# Patient Record
Sex: Female | Born: 1960 | Race: White | Hispanic: No | Marital: Married | Smoking: Never smoker
Health system: Southern US, Community
[De-identification: ages and names within clinical notes are randomized; demographics above are authoritative.]

## PROBLEM LIST (undated history)

## (undated) DIAGNOSIS — K9041 Non-celiac gluten sensitivity: Secondary | ICD-10-CM

## (undated) DIAGNOSIS — Z8719 Personal history of other diseases of the digestive system: Secondary | ICD-10-CM

## (undated) DIAGNOSIS — K219 Gastro-esophageal reflux disease without esophagitis: Secondary | ICD-10-CM

## (undated) DIAGNOSIS — T7840XA Allergy, unspecified, initial encounter: Secondary | ICD-10-CM

## (undated) DIAGNOSIS — K2970 Gastritis, unspecified, without bleeding: Secondary | ICD-10-CM

## (undated) DIAGNOSIS — K648 Other hemorrhoids: Secondary | ICD-10-CM

## (undated) DIAGNOSIS — M1990 Unspecified osteoarthritis, unspecified site: Secondary | ICD-10-CM

## (undated) DIAGNOSIS — G43909 Migraine, unspecified, not intractable, without status migrainosus: Secondary | ICD-10-CM

## (undated) DIAGNOSIS — F419 Anxiety disorder, unspecified: Secondary | ICD-10-CM

## (undated) DIAGNOSIS — E039 Hypothyroidism, unspecified: Secondary | ICD-10-CM

## (undated) DIAGNOSIS — U071 COVID-19: Secondary | ICD-10-CM

## (undated) DIAGNOSIS — R002 Palpitations: Secondary | ICD-10-CM

## (undated) HISTORY — DX: Anxiety disorder, unspecified: F41.9

## (undated) HISTORY — DX: Palpitations: R00.2

## (undated) HISTORY — DX: Gastro-esophageal reflux disease without esophagitis: K21.9

## (undated) HISTORY — DX: Hypothyroidism, unspecified: E03.9

## (undated) HISTORY — DX: Personal history of other diseases of the digestive system: Z87.19

## (undated) HISTORY — DX: Other hemorrhoids: K64.8

## (undated) HISTORY — DX: Allergy, unspecified, initial encounter: T78.40XA

## (undated) HISTORY — DX: Non-celiac gluten sensitivity: K90.41

## (undated) HISTORY — DX: Gastritis, unspecified, without bleeding: K29.70

## (undated) HISTORY — PX: WISDOM TOOTH EXTRACTION: SHX21

## (undated) HISTORY — DX: Unspecified osteoarthritis, unspecified site: M19.90

## (undated) HISTORY — DX: COVID-19: U07.1

---

## 1998-08-02 ENCOUNTER — Other Ambulatory Visit: Admission: RE | Admit: 1998-08-02 | Discharge: 1998-08-02 | Payer: Self-pay | Admitting: Obstetrics and Gynecology

## 2000-05-28 ENCOUNTER — Other Ambulatory Visit: Admission: RE | Admit: 2000-05-28 | Discharge: 2000-05-28 | Payer: Self-pay | Admitting: Obstetrics and Gynecology

## 2000-06-13 ENCOUNTER — Emergency Department (HOSPITAL_COMMUNITY): Admission: EM | Admit: 2000-06-13 | Discharge: 2000-06-13 | Payer: Self-pay | Admitting: Emergency Medicine

## 2000-08-13 ENCOUNTER — Encounter: Admission: RE | Admit: 2000-08-13 | Discharge: 2000-08-13 | Payer: Self-pay | Admitting: *Deleted

## 2000-08-13 ENCOUNTER — Encounter: Payer: Self-pay | Admitting: Allergy and Immunology

## 2001-12-01 ENCOUNTER — Other Ambulatory Visit: Admission: RE | Admit: 2001-12-01 | Discharge: 2001-12-01 | Payer: Self-pay | Admitting: Obstetrics and Gynecology

## 2001-12-09 ENCOUNTER — Encounter: Payer: Self-pay | Admitting: Internal Medicine

## 2001-12-09 ENCOUNTER — Encounter: Admission: RE | Admit: 2001-12-09 | Discharge: 2001-12-09 | Payer: Self-pay | Admitting: Internal Medicine

## 2003-01-18 ENCOUNTER — Other Ambulatory Visit: Admission: RE | Admit: 2003-01-18 | Discharge: 2003-01-18 | Payer: Self-pay | Admitting: Obstetrics and Gynecology

## 2004-03-05 ENCOUNTER — Other Ambulatory Visit: Admission: RE | Admit: 2004-03-05 | Discharge: 2004-03-05 | Payer: Self-pay | Admitting: Obstetrics and Gynecology

## 2004-12-05 ENCOUNTER — Emergency Department (HOSPITAL_COMMUNITY): Admission: EM | Admit: 2004-12-05 | Discharge: 2004-12-05 | Payer: Self-pay | Admitting: Emergency Medicine

## 2007-04-29 ENCOUNTER — Emergency Department (HOSPITAL_COMMUNITY): Admission: EM | Admit: 2007-04-29 | Discharge: 2007-04-29 | Payer: Self-pay | Admitting: Emergency Medicine

## 2007-10-31 ENCOUNTER — Encounter: Payer: Self-pay | Admitting: Internal Medicine

## 2007-11-10 ENCOUNTER — Encounter: Payer: Self-pay | Admitting: Internal Medicine

## 2008-01-20 ENCOUNTER — Ambulatory Visit: Payer: Self-pay | Admitting: Internal Medicine

## 2008-02-02 ENCOUNTER — Encounter: Payer: Self-pay | Admitting: Internal Medicine

## 2008-04-25 ENCOUNTER — Ambulatory Visit: Payer: Self-pay | Admitting: Internal Medicine

## 2008-04-25 DIAGNOSIS — M8580 Other specified disorders of bone density and structure, unspecified site: Secondary | ICD-10-CM

## 2008-04-25 DIAGNOSIS — E039 Hypothyroidism, unspecified: Secondary | ICD-10-CM | POA: Insufficient documentation

## 2008-04-25 DIAGNOSIS — R51 Headache: Secondary | ICD-10-CM | POA: Insufficient documentation

## 2008-04-25 DIAGNOSIS — N926 Irregular menstruation, unspecified: Secondary | ICD-10-CM | POA: Insufficient documentation

## 2008-04-25 DIAGNOSIS — R519 Headache, unspecified: Secondary | ICD-10-CM | POA: Insufficient documentation

## 2008-04-25 DIAGNOSIS — S82409A Unspecified fracture of shaft of unspecified fibula, initial encounter for closed fracture: Secondary | ICD-10-CM | POA: Insufficient documentation

## 2008-04-26 ENCOUNTER — Encounter (INDEPENDENT_AMBULATORY_CARE_PROVIDER_SITE_OTHER): Payer: Self-pay | Admitting: *Deleted

## 2008-04-26 LAB — CONVERTED CEMR LAB
Free T4: 0.7 ng/dL (ref 0.6–1.6)
T3, Free: 2.3 pg/mL (ref 2.3–4.2)
TSH: 4.41 microintl units/mL (ref 0.35–5.50)

## 2008-06-06 ENCOUNTER — Emergency Department (HOSPITAL_COMMUNITY): Admission: EM | Admit: 2008-06-06 | Discharge: 2008-06-07 | Payer: Self-pay | Admitting: Emergency Medicine

## 2008-09-11 ENCOUNTER — Ambulatory Visit: Payer: Self-pay | Admitting: Internal Medicine

## 2008-09-13 LAB — CONVERTED CEMR LAB
Free T4: 0.7 ng/dL (ref 0.6–1.6)
T3, Free: 2.3 pg/mL (ref 2.3–4.2)
TSH: 6.32 microintl units/mL — ABNORMAL HIGH (ref 0.35–5.50)

## 2008-09-18 ENCOUNTER — Telehealth (INDEPENDENT_AMBULATORY_CARE_PROVIDER_SITE_OTHER): Payer: Self-pay | Admitting: *Deleted

## 2008-09-26 ENCOUNTER — Ambulatory Visit: Payer: Self-pay | Admitting: Internal Medicine

## 2008-09-27 ENCOUNTER — Telehealth (INDEPENDENT_AMBULATORY_CARE_PROVIDER_SITE_OTHER): Payer: Self-pay | Admitting: *Deleted

## 2008-09-28 ENCOUNTER — Ambulatory Visit: Payer: Self-pay | Admitting: Family Medicine

## 2008-11-12 ENCOUNTER — Ambulatory Visit: Payer: Self-pay | Admitting: Internal Medicine

## 2008-11-12 ENCOUNTER — Telehealth (INDEPENDENT_AMBULATORY_CARE_PROVIDER_SITE_OTHER): Payer: Self-pay | Admitting: *Deleted

## 2008-11-18 LAB — CONVERTED CEMR LAB
Folate: >20 ng/mL
Iron: 58 ug/dL (ref 42–145)
Saturation Ratios: 17.7 % — ABNORMAL LOW (ref 20.0–50.0)
TSH: 2.97 microintl units/mL (ref 0.35–5.50)
Transferrin: 233.7 mg/dL (ref 212.0–360.0)
Vitamin B-12: 554 pg/mL (ref 211–911)

## 2008-11-19 ENCOUNTER — Encounter (INDEPENDENT_AMBULATORY_CARE_PROVIDER_SITE_OTHER): Payer: Self-pay | Admitting: *Deleted

## 2008-11-26 ENCOUNTER — Telehealth (INDEPENDENT_AMBULATORY_CARE_PROVIDER_SITE_OTHER): Payer: Self-pay | Admitting: *Deleted

## 2009-04-08 ENCOUNTER — Telehealth (INDEPENDENT_AMBULATORY_CARE_PROVIDER_SITE_OTHER): Payer: Self-pay | Admitting: *Deleted

## 2009-10-03 ENCOUNTER — Telehealth (INDEPENDENT_AMBULATORY_CARE_PROVIDER_SITE_OTHER): Payer: Self-pay | Admitting: *Deleted

## 2009-10-04 ENCOUNTER — Ambulatory Visit: Payer: Self-pay | Admitting: Internal Medicine

## 2009-10-11 LAB — CONVERTED CEMR LAB: TSH: 5.58 microintl units/mL — ABNORMAL HIGH (ref 0.35–5.50)

## 2009-10-21 ENCOUNTER — Ambulatory Visit: Payer: Self-pay | Admitting: Internal Medicine

## 2009-11-06 ENCOUNTER — Ambulatory Visit: Payer: Self-pay | Admitting: Internal Medicine

## 2010-01-22 ENCOUNTER — Ambulatory Visit: Payer: Self-pay | Admitting: Internal Medicine

## 2010-01-27 LAB — CONVERTED CEMR LAB: TSH: 5.98 microintl units/mL — ABNORMAL HIGH (ref 0.35–5.50)

## 2010-02-14 ENCOUNTER — Telehealth: Payer: Self-pay | Admitting: Internal Medicine

## 2010-03-28 ENCOUNTER — Ambulatory Visit
Admission: RE | Admit: 2010-03-28 | Discharge: 2010-03-28 | Payer: Self-pay | Source: Home / Self Care | Attending: Internal Medicine | Admitting: Internal Medicine

## 2010-03-28 ENCOUNTER — Telehealth (INDEPENDENT_AMBULATORY_CARE_PROVIDER_SITE_OTHER): Payer: Self-pay | Admitting: *Deleted

## 2010-03-30 LAB — HM COLONOSCOPY: HM COLON: NORMAL

## 2010-04-01 LAB — CONVERTED CEMR LAB
ALT: 21 units/L (ref 0–35)
Basophils Absolute: 0 10*3/uL (ref 0.0–0.1)
CO2: 27 meq/L (ref 19–32)
Calcium: 9.4 mg/dL (ref 8.4–10.5)
Chloride: 111 meq/L (ref 96–112)
Creatinine, Ser: 0.9 mg/dL (ref 0.4–1.2)
GFR calc non Af Amer: 69.39 mL/min (ref >60.00)
Hemoglobin: 13.5 g/dL (ref 12.0–15.0)
Lymphocytes Relative: 30.5 % (ref 12.0–46.0)
Monocytes Relative: 10.5 % (ref 3.0–12.0)
Neutro Abs: 3.1 10*3/uL (ref 1.4–7.7)
RBC: 4.15 M/uL (ref 3.87–5.11)
RDW: 13.2 % (ref 11.5–14.6)
Total Protein: 7.1 g/dL (ref 6.0–8.3)
WBC: 5.8 10*3/uL (ref 4.5–10.5)

## 2010-04-14 ENCOUNTER — Telehealth: Payer: Self-pay | Admitting: Internal Medicine

## 2010-04-14 ENCOUNTER — Encounter: Payer: Self-pay | Admitting: Internal Medicine

## 2010-04-14 DIAGNOSIS — R454 Irritability and anger: Secondary | ICD-10-CM | POA: Insufficient documentation

## 2010-04-17 ENCOUNTER — Other Ambulatory Visit: Payer: Self-pay | Admitting: Internal Medicine

## 2010-04-17 ENCOUNTER — Ambulatory Visit
Admission: RE | Admit: 2010-04-17 | Discharge: 2010-04-17 | Payer: Self-pay | Source: Home / Self Care | Attending: Internal Medicine | Admitting: Internal Medicine

## 2010-04-18 LAB — FOLLICLE STIMULATING HORMONE: FSH: 67.7 m[IU]/mL

## 2010-04-29 NOTE — Assessment & Plan Note (Signed)
Summary: MIGRAINES/KN   Vital Signs:  Patient profile:   50 year old female Weight:      140.8 pounds Temp:     98.8 degrees F oral Pulse rate:   80 / minute Resp:     15 per minute BP sitting:   118 / 70  (left arm) Cuff size:   regular  Vitals Entered By: Shonna Chock CMA (November 06, 2009 10:50 AM) CC: Migraines and TSH concerns, overall patient not feeling well. Patient with 9 migraines since 51/24/2011, patient seen the [facility] dooctor 1 week ago and will f/u tomorrow, Headaches   CC:  Migraines and TSH concerns, overall patient not feeling well. Patient with 9 migraines since 51/24/2011, patient seen the [facility] dooctor 1 week ago and will f/u tomorrow, and Headaches.  History of Present Illness:  Headaches      This is a 50 year old woman who presents with  an increase in her migraine headaches, 9 since 09/21/2009.  The patient reports nausea, photophobia, and phonophobia, but denies vomiting, sweats, tearing of eyes, nasal congestion, sinus pain, and sinus pressure.  The headache is described as intermittent and pressure-like, initially over crown  but then frontally  & in temples.  The patient denies the following high-risk features: fever, vision loss or change, focal weakness, and altered mental status.  The headaches  have no definite triggers.  Prior treatment has included a triptan and a muscle relaxer. No prodrome or aura.  Current Medications (verified): 1)  Topamax 37.5mg  .... 1 By Mouth At Bedtime 2)  Magnesium 500 Mg Tabs (Magnesium) .Marland Kitchen.. 1 By Mouth Once Daily 3)  Multivitamins  Tabs (Multiple Vitamin) .Marland Kitchen.. 1 By Mouth Q D 4)  Fish Oil  Oil (Fish Oil) .... 600mg  1 By Mouth Once Daily 5)  Vitamin D3 2000 Unit Caps (Cholecalciferol) .Marland Kitchen.. 1 By Mouth Once Daily 6)  Calcium 600 Mg Tabs (Calcium) .Marland Kitchen.. 1 By Mouth Once Daily 7)  Ketoprofen 50 Mg Caps (Ketoprofen) .... As Needed 8)  Imitrex 100 Mg Tabs (Sumatriptan Succinate) .Marland Kitchen.. 1 By Mouth Once Daily 9)  Klonopin 0.5 Mg  Tabs (Clonazepam) .... 1/2 By Mouth Once Daily As Needed 10)  Flexeril 10 Mg Tabs (Cyclobenzaprine Hcl) .... 1/2 By Mouth As Needed 11)  Synthroid 25 Mcg Tabs (Levothyroxine Sodium) .Marland Kitchen.. 1 By Mouth Once Daily Except 1& 1/2 Tues & Sat 12)  Nasonex 50 Mcg/act Susp (Mometasone Furoate) .Marland Kitchen.. 1 Sprays Each Nostril Once Daily As Needed  Allergies: 1)  ! Penicillin  Physical Exam  General:  well-nourished,in no acute distress; alert,appropriate and cooperative throughout examination Eyes:  No corneal or conjunctival inflammation noted. EOMI. Perrla. Field of  Vision grossly normal. Pulses:  R and L carotid  pulses are full and equal bilaterally Neurologic:  alert & oriented X3, cranial nerves II-XII intact, strength normal in all extremities, gait normal, DTRs symmetrical and normal, finger-to-nose normal, and Romberg negative.     Impression & Recommendations:  Problem # 1:  HEADACHE (ICD-784.0) Migraine flares Her updated medication list for this problem includes:    Ketoprofen 50 Mg Caps (Ketoprofen) .Marland Kitchen... As needed    Imitrex 100 Mg Tabs (Sumatriptan succinate) .Marland Kitchen... 1 by mouth once daily  Problem # 2:  HYPOTHYROIDISM (ICD-244.9)  Her updated medication list for this problem includes:    Synthroid 25 Mcg Tabs (Levothyroxine sodium) .Marland Kitchen... 1 by mouth once daily except 1& 1/2 tues & sat  Complete Medication List: 1)  Topamax 37.5mg   .... 1 by  mouth at bedtime 2)  Magnesium 500 Mg Tabs (Magnesium) .Marland Kitchen.. 1 by mouth once daily 3)  Multivitamins Tabs (Multiple vitamin) .Marland Kitchen.. 1 by mouth q d 4)  Fish Oil Oil (Fish oil) .... 600mg  1 by mouth once daily 5)  Vitamin D3 2000 Unit Caps (Cholecalciferol) .Marland Kitchen.. 1 by mouth once daily 6)  Calcium 600 Mg Tabs (Calcium) .Marland Kitchen.. 1 by mouth once daily 7)  Ketoprofen 50 Mg Caps (Ketoprofen) .... As needed 8)  Imitrex 100 Mg Tabs (Sumatriptan succinate) .Marland Kitchen.. 1 by mouth once daily 9)  Klonopin 0.5 Mg Tabs (Clonazepam) .... 1/2 by mouth once daily as needed 10)   Flexeril 10 Mg Tabs (Cyclobenzaprine hcl) .... 1/2 by mouth as needed 11)  Synthroid 25 Mcg Tabs (Levothyroxine sodium) .Marland Kitchen.. 1 by mouth once daily except 1& 1/2 tues & sat 12)  Nasonex 50 Mcg/act Susp (Mometasone furoate) .Marland Kitchen.. 1 sprays each nostril once daily as needed 13)  Topiramate 50 Mg Tabs (Topiramate) .Marland Kitchen.. 1 at bedtime  Patient Instructions: 1)  Keep Headcahe  Diary  as discussed. Prescriptions: TOPIRAMATE 50 MG TABS (TOPIRAMATE) 1 at bedtime  #30 x 0   Entered and Authorized by:   Marga Melnick MD   Signed by:   Marga Melnick MD on 11/06/2009   Method used:   Print then Give to Patient   RxID:   [id number]

## 2010-04-29 NOTE — Progress Notes (Signed)
Summary: THYROID CHECK  Phone Note Call from Patient   Caller: Patient Summary of Call: PT CALLS AND STATES THAT DR. HOPPER LIKES TO HAVE HER THYROID CHECKED EVERY YEAR. PLEASE ADVISE WITH THE ORDER SO WE CAN GET AN APPT SET UP FOR HER. PT CAN BE CONTACTED AT [phone] Initial call taken by: Lavell Islam,  October 03, 2009 11:36 AM  Follow-up for Phone Call        TSH 244.9 Follow-up by: Shonna Chock,  October 03, 2009 11:42 AM  Additional Follow-up for Phone Call Additional follow up Details #1::        PT HAS AN APPT FOR THE LAB ON 10/04/09 Additional Follow-up by: Lavell Islam,  October 03, 2009 11:47 AM

## 2010-04-29 NOTE — Progress Notes (Signed)
Summary: Relase records  Phone Note From Other Clinic   Summary of Call: [facility] called requesting records for a referral made by Dr.Hopper on 03/08/09. I called the pt and left a message to get permission relase these records because I do not see a referral in the computer.  Initial call taken by: Army Fossa CMA,  April 08, 2009 3:04 PM  Follow-up for Phone Call        left message to call  office..............Marland KitchenFelecia Deloach CMA  April 11, 2009 8:30 AM   Additional Follow-up for Phone Call Additional follow up Details #1::        spoke with pt dr hopper did not refer pt. pt will come in to sign medical release form so that we may share records with them................Marland KitchenFelecia Deloach CMA  April 12, 2009 8:30 AM

## 2010-04-29 NOTE — Progress Notes (Signed)
Summary: Endo referral  Phone Note Call from Patient   Summary of Call: Patient called noting that she is still not feeling well and would like referral to endo. She would like more in depth testing with endo MD. She would like to know if she can see a woman MD in [place] and her name is Dr. Rachel Moulds MD with [facility]. Please advise. Initial call taken by: Lucious Groves CMA,  February 14, 2010 9:54 AM  Follow-up for Phone Call        COMPLETED REFERRAL FORM & ALL NOTES/LABS FAXED TO [facility], FAX [phone], THEY CONTACT PT, AWAITING APPT.  Magdalen Spatz [facility]  February 14, 2010 3:42 PM

## 2010-04-29 NOTE — Assessment & Plan Note (Signed)
Summary: CHECK UP BEFORE SCRIPT IS FILL--PH   Vital Signs:  Patient profile:   50 year old female Weight:      143.8 pounds BMI:     24.02 Temp:     98.0 degrees F oral Pulse rate:   72 / minute Resp:     15 per minute BP sitting:   116 / 62  (left arm) Cuff size:   regular  Vitals Entered By: Shonna Chock CMA (October 21, 2009 4:27 PM) CC: Follow-up visit: discuss labs    CC:  Follow-up visit: discuss labs .  History of Present Illness: Labs reviewed ; TSH 5.58, borderline hypothyroidism.  Current Medications (verified): 1)  Zoloft 37.5 Mg Tabs (Sertraline Hcl) .Marland Kitchen.. 1 By Mouth Once Daily 2)  Topamax 25 Mg Tabs (Topiramate) .Marland Kitchen.. 1 By Mouth At Bedtime 3)  Magnesium 500 Mg Tabs (Magnesium) .Marland Kitchen.. 1 By Mouth Once Daily 4)  Multivitamins  Tabs (Multiple Vitamin) .Marland Kitchen.. 1 By Mouth Q D 5)  Fish Oil  Oil (Fish Oil) .... 600mg  1 By Mouth Once Daily 6)  Vitamin D3 2000 Unit Caps (Cholecalciferol) .Marland Kitchen.. 1 By Mouth Once Daily 7)  Calcium 600 Mg Tabs (Calcium) .Marland Kitchen.. 1 By Mouth Once Daily 8)  Ketoprofen 50 Mg Caps (Ketoprofen) .... As Needed 9)  Imitrex 100 Mg Tabs (Sumatriptan Succinate) .Marland Kitchen.. 1 By Mouth Once Daily 10)  Klonopin 0.5 Mg Tabs (Clonazepam) .... 1/2 By Mouth Once Daily As Needed 11)  Flexeril 10 Mg Tabs (Cyclobenzaprine Hcl) .... 1/2 By Mouth As Needed 12)  Synthroid 25 Mcg Tabs (Levothyroxine Sodium) .Marland Kitchen.. 1 By Mouth Once Daily 13)  Nasonex 50 Mcg/act Susp (Mometasone Furoate) .Marland Kitchen.. 1 Sprays Each Nostril Once Daily As Needed  Allergies: 1)  ! Penicillin  Past History:  Past Medical History: Hypothyroidism(TSH 8.140 on 10/31/2007 Osteopenia (T  SCORE -1.1 @ FEMORAL NECK) Headache, migraines , Dr Neale Burly Dysfunctional  menses; FSH 40.1 (23-116.3 = post menopausal range) 02/02/08  Past Surgical History: G 0 P0, Dr Tenny Craw Denies surgical history Colonoscopy negative 05/2009  Family History: DM: MGF Father: liver cancer ,colon polyps,HTN Mother: depression, diverticulosis,S/P  partial colectomy, hypothyroidism  Siblings: sister colon polyps,HTN  Review of Systems General:  Denies fatigue and weight loss; Weight  up 6#. Eyes:  Denies blurring, double vision, and vision loss-both eyes. ENT:  Denies difficulty swallowing and hoarseness. CV:  Denies palpitations. GI:  Complains of constipation; denies diarrhea. Derm:  Complains of hair loss; denies changes in nail beds and dryness. Neuro:  Denies numbness and tingling. Endo:  Denies cold intolerance and heat intolerance.  Physical Exam  General:  Appears younger than age,well-nourished; alert,appropriate and cooperative throughout examination Eyes:  No corneal or conjunctival inflammation noted.No lid lag Neck:  No deformities, masses, or tenderness noted. Lungs:  Normal respiratory effort, chest expands symmetrically. Lungs are clear to auscultation, no crackles or wheezes. Heart:  Normal rate and regular rhythm. S1 and S2 normal without gallop, murmur, click, rub.S4 Pulses:  R and L carotid,radial,dorsalis pedis and posterior tibial pulses are full and equal bilaterally Extremities:  No clubbing, cyanosis, edema. Neurologic:  alert & oriented X3 and DTRs symmetrical and normal.   No tremor Skin:  Intact without suspicious lesions or rashes Psych:  memory intact for recent and remote, normally interactive, and good eye contact.     Impression & Recommendations:  Problem # 1:  HYPOTHYROIDISM (ICD-244.9)  Her updated medication list for this problem includes:    Synthroid 25 Mcg Tabs (Levothyroxine sodium) .Marland KitchenMarland KitchenMarland KitchenMarland Kitchen  1 by mouth once daily except 1& 1/2 tues & sat  Problem # 2:  OSTEOPENIA (ICD-733.90)  Complete Medication List: 1)  Zoloft 37.5 Mg Tabs (sertraline Hcl)  .Marland Kitchen.. 1 by mouth once daily 2)  Topamax 25 Mg Tabs (Topiramate) .Marland Kitchen.. 1 by mouth at bedtime 3)  Magnesium 500 Mg Tabs (Magnesium) .Marland Kitchen.. 1 by mouth once daily 4)  Multivitamins Tabs (Multiple vitamin) .Marland Kitchen.. 1 by mouth q d 5)  Fish Oil Oil (Fish  oil) .... 600mg  1 by mouth once daily 6)  Vitamin D3 2000 Unit Caps (Cholecalciferol) .Marland Kitchen.. 1 by mouth once daily 7)  Calcium 600 Mg Tabs (Calcium) .Marland Kitchen.. 1 by mouth once daily 8)  Ketoprofen 50 Mg Caps (Ketoprofen) .... As needed 9)  Imitrex 100 Mg Tabs (Sumatriptan succinate) .Marland Kitchen.. 1 by mouth once daily 10)  Klonopin 0.5 Mg Tabs (Clonazepam) .... 1/2 by mouth once daily as needed 11)  Flexeril 10 Mg Tabs (Cyclobenzaprine hcl) .... 1/2 by mouth as needed 12)  Synthroid 25 Mcg Tabs (Levothyroxine sodium) .Marland Kitchen.. 1 by mouth once daily except 1& 1/2 tues & sat 13)  Nasonex 50 Mcg/act Susp (Mometasone furoate) .Marland Kitchen.. 1 sprays each nostril once daily as needed  Patient Instructions: 1)  Please schedule a follow-up  Lab appointment in 3 months for TSH (244.9). BMD every 25 months; monitor vitamin D level annually ; goal = 40-60. Calcium 600 mg two times a day ; take @ least 1000 International Units vit D3 once daily . Annual monitor of Lipids  Prescriptions: SYNTHROID 25 MCG TABS (LEVOTHYROXINE SODIUM) 1 by mouth once daily EXCEPT 1& 1/2 Tues & Sat  #90 x 3   Entered and Authorized by:   Marga Melnick MD   Signed by:   Marga Melnick MD on 10/21/2009   Method used:   Print then Give to Patient   RxID:   [id number]

## 2010-05-01 NOTE — Progress Notes (Signed)
Summary: Bone Density Test  Phone Note Call from Patient Call back at [phone]   Caller: Patient Reason for Call: Talk to Doctor Summary of Call: Patient came in this morning stating that the last time she was in to see Dr. Alwyn Ren he told her she should have another bone density test soon and to speak with her GYN about this. In the mean time she recieved a card in the mail from [employer] about it being time to schedule another bone density, that was where she had one in the past. She spoke with her GYN and he said he would not recommend this until she was at least 59, but if Dr. Alwyn Ren said she needed one he would need to provide the required referral. She would like for [place] to put the referral in if you think this is something she still needs. Please advise.  Initial call taken by: Harold Barban,  March 28, 2010 8:49 AM  Follow-up for Phone Call        she has documented Osteopenia (733.90); BMD every 25 months & vitamin D level every 12 months Follow-up by: Marga Melnick MD,  March 28, 2010 1:36 PM  Additional Follow-up for Phone Call Additional follow up Details #1::        Referral placed, patient is aware.  Additional Follow-up by: Harold Barban,  March 28, 2010 1:44 PM

## 2010-05-01 NOTE — Progress Notes (Signed)
Summary: Labs needed  Phone Note Call from Patient   Summary of Call: Patient called about her lab results, she needs them sent to her MD. She is aware that this is done.   Patient order was not clear, so FSH and Estrodial were not done on 12/30. I did try to add FSH at the time and lab did not permit. Estrodial was handwritten and later confirmed. All other labs were faxed to pt GYN. Patient is aware and will come back for these to be drawn. Initial call taken by: Lucious Groves CMA,  April 14, 2010 10:59 AM

## 2010-05-07 ENCOUNTER — Telehealth: Payer: Self-pay | Admitting: Internal Medicine

## 2010-05-07 NOTE — Consult Note (Signed)
Summary: [facility]  [facility]   Imported By: Lanelle Bal 04/29/2010 13:02:57  _____________________________________________________________________  External Attachment:    Type:   Image     Comment:   External Document

## 2010-05-12 ENCOUNTER — Encounter: Payer: Self-pay | Admitting: Internal Medicine

## 2010-05-15 NOTE — Progress Notes (Signed)
Summary: RX request--Migraine/nausea  Phone Note Call from Patient Call back at [phone]   Summary of Call: Patient called noting that she was out of work today with migraine and really bad nausea. Patient notes that she is treated by the [facility], but her MD is out of town for a conference. [facility] for migraine as well as nausea (phenergan). Patient would like for her husband to be able to pick it up today. Please advise. Initial call taken by: Lucious Groves CMA,  May 07, 2010 4:06 PM  Follow-up for Phone Call        Phenergan 25 mg  q 6 hrs as needed #6. Imitrex 100 mg #6 or 9 , whichever her plan allows Follow-up by: Marga Melnick MD,  May 07, 2010 4:24 PM  Additional Follow-up for Phone Call Additional follow up Details #1::        I think her plan will allow 9, which is that standard. Patient notified that rxs were sent. Lucious Groves CMA  May 07, 2010 4:34 PM     New/Updated Medications: PROMETHAZINE HCL 25 MG TABS (PROMETHAZINE HCL) 1 by mouth q 6h as needed nausea IMITREX 100 MG TABS (SUMATRIPTAN SUCCINATE) 1 by mouth at onset of migraine Prescriptions: IMITREX 100 MG TABS (SUMATRIPTAN SUCCINATE) 1 by mouth at onset of migraine  #9 x 0   Entered by:   Lucious Groves CMA   Authorized by:   Marga Melnick MD   Signed by:   Lucious Groves CMA on 05/07/2010   Method used:   Printed then faxed to ...       [facility]  [address] [phone]* (retail)       [address]       [place], [place]  [address]       Ph: [phone] or [phone]       Fax: [phone]   RxID:   [id number] PROMETHAZINE HCL 25 MG TABS (PROMETHAZINE HCL) 1 by mouth q 6h as needed nausea  #6 x 0   Entered by:   Lucious Groves CMA   Authorized by:   Marga Melnick MD   Signed by:   Lucious Groves CMA on 05/07/2010   Method used:   Printed then faxed to ...       [facility] [address] [phone]* (retail)       [address]       [place], [place]  [address]       Ph: [phone] or [phone]       Fax: [phone]   RxID:   [id number]

## 2010-06-02 ENCOUNTER — Encounter: Payer: Self-pay | Admitting: Internal Medicine

## 2010-06-02 DIAGNOSIS — R5381 Other malaise: Secondary | ICD-10-CM | POA: Insufficient documentation

## 2010-06-17 NOTE — Consult Note (Signed)
Summary: [facility]   Imported By: Kassie Mends 06/12/2010 08:57:02  _____________________________________________________________________  External Attachment:    Type:   Image     Comment:   External Document

## 2010-07-10 LAB — URINALYSIS, ROUTINE W REFLEX MICROSCOPIC
Bilirubin Urine: NEGATIVE
Ketones, ur: NEGATIVE mg/dL
Nitrite: NEGATIVE
Urobilinogen, UA: 0.2 mg/dL (ref 0.0–1.0)
pH: 5.5 (ref 5.0–8.0)

## 2010-07-10 LAB — COMPREHENSIVE METABOLIC PANEL
AST: 26 U/L (ref 0–37)
Albumin: 4 g/dL (ref 3.5–5.2)
BUN: 13 mg/dL (ref 6–23)
Calcium: 8.7 mg/dL (ref 8.4–10.5)
Chloride: 109 mEq/L (ref 96–112)
Creatinine, Ser: 0.81 mg/dL (ref 0.4–1.2)
GFR calc Af Amer: >60 mL/min (ref >60)
Total Protein: 6.9 g/dL (ref 6.0–8.3)

## 2010-07-10 LAB — CBC
HCT: 40 % (ref 36.0–46.0)
MCV: 92.4 fL (ref 78.0–100.0)
Platelets: 175 10*3/uL (ref 150–400)
RDW: 12.4 % (ref 11.5–15.5)
WBC: 6.5 10*3/uL (ref 4.0–10.5)

## 2010-07-10 LAB — CLOSTRIDIUM DIFFICILE EIA

## 2010-07-10 LAB — URINE MICROSCOPIC-ADD ON

## 2010-07-10 LAB — DIFFERENTIAL
Basophils Absolute: 0.1 10*3/uL (ref 0.0–0.1)
Eosinophils Relative: 0 % (ref 0–5)
Lymphocytes Relative: 5 % — ABNORMAL LOW (ref 12–46)
Lymphs Abs: 0.3 10*3/uL — ABNORMAL LOW (ref 0.7–4.0)
Monocytes Absolute: 0.2 10*3/uL (ref 0.1–1.0)
Monocytes Relative: 4 % (ref 3–12)
Neutro Abs: 5.9 10*3/uL (ref 1.7–7.7)

## 2010-07-10 LAB — POCT PREGNANCY, URINE: Preg Test, Ur: NEGATIVE

## 2010-07-29 DIAGNOSIS — E039 Hypothyroidism, unspecified: Secondary | ICD-10-CM

## 2010-07-29 HISTORY — DX: Hypothyroidism, unspecified: E03.9

## 2010-08-09 ENCOUNTER — Emergency Department (HOSPITAL_COMMUNITY): Payer: BC Managed Care – PPO

## 2010-08-09 ENCOUNTER — Emergency Department (HOSPITAL_COMMUNITY)
Admission: EM | Admit: 2010-08-09 | Discharge: 2010-08-09 | Disposition: A | Discharge status: Home / Self Care | Payer: BC Managed Care – PPO | Attending: Emergency Medicine | Admitting: Emergency Medicine

## 2010-08-09 DIAGNOSIS — R51 Headache: Secondary | ICD-10-CM | POA: Insufficient documentation

## 2010-08-09 DIAGNOSIS — R110 Nausea: Secondary | ICD-10-CM | POA: Insufficient documentation

## 2012-06-07 ENCOUNTER — Ambulatory Visit (INDEPENDENT_AMBULATORY_CARE_PROVIDER_SITE_OTHER): Payer: BC Managed Care – PPO | Admitting: Family Medicine

## 2012-06-07 VITALS — BP 110/64 | HR 81 | Temp 98.4°F | Resp 16 | Ht 64.75 in | Wt 115.0 lb

## 2012-06-07 MED ORDER — DEXAMETHASONE SODIUM PHOSPHATE 10 MG/ML IJ SOLN: 10.0000 mg | Freq: Once | INTRAMUSCULAR | Status: DC

## 2012-06-07 MED ORDER — KETOROLAC TROMETHAMINE 60 MG/2ML IM SOLN
60.0000 mg | Freq: Once | INTRAMUSCULAR | Status: AC
  Administered 2012-06-07: 60 mg via INTRAMUSCULAR

## 2012-06-07 MED ORDER — DEXAMETHASONE 1 MG/ML PO CONC
10.0000 mg | Freq: Once | ORAL | Status: AC
  Administered 2012-06-07: 10 mg via ORAL

## 2012-06-07 MED ORDER — PROMETHAZINE HCL 50 MG/ML IJ SOLN
25.0000 mg | Freq: Once | INTRAMUSCULAR | Status: AC
  Administered 2012-06-07: 25 mg via INTRAMUSCULAR

## 2012-06-07 NOTE — Patient Instructions (Addendum)
Thank you for coming in today. If you are getting worse please go to the ER.  The shot should make you sleepy.  Come back or go to the emergency room if you notice new weakness new numbness problems walking or bowel or bladder problems.

## 2012-06-07 NOTE — Progress Notes (Signed)
Laura Brock is a 52 y.o. female who presents to [facility] today for migraine.   Patient has chronic migraine disorder. She typically take Imitrex and Ketoprofen for migraines and notes this time it has not helped.  She denies any weakness numbness change in vision fevers chills or vomiting. She describes her headache is bitemporal and  pounding.  It has been present for the last 5 days.  She feels well otherwise.  Additionally she denies any clumsiness difficulty with speech and her husband says that her thought process appears to be normal.    PMH: Reviewed chronic migraines and hypothyroidism History  Substance Use Topics  . Smoking status: Never Smoker   . Smokeless tobacco: Not on file  . Alcohol Use: Not on file   ROS as above  Medications reviewed. Current Outpatient Prescriptions  Medication Sig Dispense Refill  . clonazePAM (KLONOPIN) 0.5 MG tablet Take 0.25 mg by mouth 2 (two) times daily as needed for anxiety.      . ergocalciferol (VITAMIN D2) 50000 UNITS capsule Take 50,000 Units by mouth once a week.      . ketoprofen (ORUDIS) 75 MG capsule Take 75 mg by mouth 4 (four) times daily as needed for pain.      . magnesium aspartate (MAGINEX) 615 MG tablet Take 615 mg by mouth 2 (two) times daily.      . Multiple Vitamin (MULTIVITAMIN) tablet Take 1 tablet by mouth daily.      . SUMAtriptan (IMITREX) 100 MG tablet Take 100 mg by mouth every 2 (two) hours as needed for migraine.      . thyroid (ARMOUR) 60 MG tablet Take 60 mg by mouth daily.       Current Facility-Administered Medications  Medication Dose Route Frequency Wasif Simonich Last Rate Last Dose  . dexamethasone (DECADRON) injection 10 mg  10 mg Intramuscular Once Rodolph Bong, MD      . ketorolac (TORADOL) injection 60 mg  60 mg Intramuscular Once Rodolph Bong, MD      . promethazine (PHENERGAN) injection 25 mg  25 mg Intramuscular Once Rodolph Bong, MD        Exam:  BP 110/64  Pulse 81  Temp(Src) 98.4 F (36.9 C) (Oral)   Resp 16  Ht 5' 4.75" (1.645 m)  Wt 115 lb (52.164 kg)  BMI 19.28 kg/m2  SpO2 98% Gen: Well NAD HEENT: EOMI,  MMM PERRL Lungs: CTABL Nl WOB Heart: RRR no MRG Abd: NABS, NT, ND Exts: Non edematous BL  LE, warm and well perfused.  Neuro: Alert and oriented x3 cranial nerves II through XII are intact strength sensation and balance are intact coordination is intact.  Speech and thought process are within normal limits.  No results found for this or any previous visit (from the past 72 hour(s)).  Assessment and Plan: 52 y.o. female with acute migraine with history of chronic migraine disorder.  Failed oral therapy with Imitrex and ketoprofen.  Plan IM Toradol dexamethasone and Phenergan. Followup in the emergency room if worsening or here if not improved. Discussed warning signs or symptoms. Please see discharge instructions. Patient expresses understanding.

## 2012-06-09 ENCOUNTER — Emergency Department (HOSPITAL_COMMUNITY)
Admission: EM | Admit: 2012-06-09 | Discharge: 2012-06-09 | Disposition: A | Discharge status: Home / Self Care | Payer: BC Managed Care – PPO | Attending: Emergency Medicine | Admitting: Emergency Medicine

## 2012-06-09 ENCOUNTER — Other Ambulatory Visit: Payer: Self-pay

## 2012-06-09 ENCOUNTER — Encounter (HOSPITAL_COMMUNITY): Payer: Self-pay | Admitting: *Deleted

## 2012-06-09 DIAGNOSIS — R5381 Other malaise: Secondary | ICD-10-CM | POA: Insufficient documentation

## 2012-06-09 DIAGNOSIS — R110 Nausea: Secondary | ICD-10-CM | POA: Insufficient documentation

## 2012-06-09 DIAGNOSIS — R51 Headache: Secondary | ICD-10-CM | POA: Insufficient documentation

## 2012-06-09 DIAGNOSIS — Z79899 Other long term (current) drug therapy: Secondary | ICD-10-CM | POA: Insufficient documentation

## 2012-06-09 DIAGNOSIS — G4700 Insomnia, unspecified: Secondary | ICD-10-CM | POA: Insufficient documentation

## 2012-06-09 DIAGNOSIS — IMO0001 Reserved for inherently not codable concepts without codable children: Secondary | ICD-10-CM | POA: Insufficient documentation

## 2012-06-09 DIAGNOSIS — G479 Sleep disorder, unspecified: Secondary | ICD-10-CM | POA: Insufficient documentation

## 2012-06-09 DIAGNOSIS — E079 Disorder of thyroid, unspecified: Secondary | ICD-10-CM | POA: Insufficient documentation

## 2012-06-09 DIAGNOSIS — R0602 Shortness of breath: Secondary | ICD-10-CM | POA: Insufficient documentation

## 2012-06-09 DIAGNOSIS — G43909 Migraine, unspecified, not intractable, without status migrainosus: Secondary | ICD-10-CM | POA: Insufficient documentation

## 2012-06-09 HISTORY — DX: Migraine, unspecified, not intractable, without status migrainosus: G43.909

## 2012-06-09 LAB — COMPREHENSIVE METABOLIC PANEL
Alkaline Phosphatase: 52 U/L (ref 39–117)
BUN: 25 mg/dL — ABNORMAL HIGH (ref 6–23)
Chloride: 102 mEq/L (ref 96–112)
Creatinine, Ser: 0.84 mg/dL (ref 0.50–1.10)
GFR calc Af Amer: >90 mL/min (ref >90)
Glucose, Bld: 99 mg/dL (ref 70–99)
Potassium: 3.8 mEq/L (ref 3.5–5.1)
Total Bilirubin: 0.3 mg/dL (ref 0.3–1.2)
Total Protein: 7.4 g/dL (ref 6.0–8.3)

## 2012-06-09 LAB — URINALYSIS, ROUTINE W REFLEX MICROSCOPIC
Ketones, ur: NEGATIVE mg/dL
Leukocytes, UA: NEGATIVE
Nitrite: NEGATIVE
Protein, ur: NEGATIVE mg/dL

## 2012-06-09 LAB — CBC WITH DIFFERENTIAL/PLATELET
Eosinophils Absolute: 0.3 10*3/uL (ref 0.0–0.7)
HCT: 36 % (ref 36.0–46.0)
Hemoglobin: 12.6 g/dL (ref 12.0–15.0)
Lymphs Abs: 2.3 10*3/uL (ref 0.7–4.0)
MCH: 32.8 pg (ref 26.0–34.0)
MCHC: 35 g/dL (ref 30.0–36.0)
Monocytes Absolute: 0.6 10*3/uL (ref 0.1–1.0)
Monocytes Relative: 9 % (ref 3–12)
Neutrophils Relative %: 50 % (ref 43–77)
RBC: 3.84 MIL/uL — ABNORMAL LOW (ref 3.87–5.11)

## 2012-06-09 NOTE — ED Notes (Signed)
Per family member pt had silver teeth fillings removed under advise of Dr Allena Napoleon; pt given shot of DMPS 250 mg on 05/23/12 for continued treatment of trial chelation therapy by Dr Alessandra Bevels; since then pt has had itchy skin; shortness of breath; weakness; insomnia; nausea; headache Thurs to Tues; muscle aches.  Pt began this therapy to help heal "permeability of the gut" to help alleviate food sensitivities.

## 2012-06-09 NOTE — ED Notes (Signed)
Pt with genarlized malaise.

## 2012-06-09 NOTE — ED Provider Notes (Signed)
History     CSN: [id number]  Arrival date & time 06/09/12  1843   First MD Initiated Contact with Patient 06/09/12 2119      No chief complaint on file.   (Consider location/radiation/quality/duration/timing/severity/associated sxs/prior treatment) HPI Comments: Patient comes to the ER for evaluation of possible drug reaction. Patient reports that she has recently started seeing an "integrative medicine" doctor. 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 She had that performed and then her doctor recommended chelation therapy. Patient reports that she declined this, but then was given a shot of DMPS to treat the mercury. Patient reports that since she was given this medication she has had multiple symptoms. Patient reports itchy burning skin, but has not seen any rash. She has felt generalized weakness, nausea and headache. Patient reports generalized muscle aches and cramping. She has been short of breath. She also reports insomnia.   Past Medical History  Diagnosis Date  . Allergy   . Thyroid disease   . Migraine     History reviewed. No pertinent past surgical history.  Family History  Problem Relation Age of Onset  . Cancer Father   . Diabetes Maternal Grandfather   . Heart disease Paternal Grandfather     History  Substance Use Topics  . Smoking status: Never Smoker   . Smokeless tobacco: Not on file  . Alcohol Use: No    OB History   Grav Para Term Preterm Abortions TAB SAB Ect Mult Living                  Review of Systems  Constitutional: Positive for fatigue.  Respiratory: Positive for shortness of breath.   Gastrointestinal: Positive for nausea.  Musculoskeletal: Positive for myalgias.  Skin:       itching  Neurological: Positive for headaches.  Psychiatric/Behavioral: Positive for sleep disturbance.  All other systems reviewed and are negative.    Allergies  Penicillins  Home  Medications   Current Outpatient Rx  Name  Route  Sig  Dispense  Refill  . clonazePAM (KLONOPIN) 0.5 MG tablet   Oral   Take 0.25 mg by mouth 2 (two) times daily as needed for anxiety.         Marland Kitchen ketoprofen (ORUDIS) 75 MG capsule   Oral   Take 75 mg by mouth 4 (four) times daily as needed for pain.         . magnesium aspartate (MAGINEX) 615 MG tablet   Oral   Take 615 mg by mouth 2 (two) times daily.         . Multiple Vitamin (MULTIVITAMIN) tablet   Oral   Take 1 tablet by mouth daily.         . SUMAtriptan (IMITREX) 100 MG tablet   Oral   Take 100 mg by mouth every 2 (two) hours as needed for migraine.         . thyroid (ARMOUR) 60 MG tablet   Oral   Take 60 mg by mouth daily.           BP 99/66  Pulse 71  Temp(Src) 98.4 F (36.9 C) (Oral)  Resp 20  SpO2 98%  Physical Exam  Constitutional: She is oriented to person, place, and time. She appears well-developed and well-nourished. No distress.  HENT:  Head: Normocephalic and atraumatic.  Right Ear: Hearing normal.  Nose: Nose normal.  Mouth/Throat: Oropharynx is clear and moist and mucous  membranes are normal.  Eyes: Conjunctivae and EOM are normal. Pupils are equal, round, and reactive to light.  Neck: Normal range of motion. Neck supple.  Cardiovascular: Normal rate, regular rhythm, S1 normal and S2 normal.  Exam reveals no gallop and no friction rub.   No murmur heard. Pulmonary/Chest: Effort normal and breath sounds normal. No respiratory distress. She exhibits no tenderness.  Abdominal: Soft. Normal appearance and bowel sounds are normal. There is no hepatosplenomegaly. There is no tenderness. There is no rebound, no guarding, no tenderness at McBurney's point and negative Murphy's sign. No hernia.  Musculoskeletal: Normal range of motion.  Neurological: She is alert and oriented to person, place, and time. She has normal strength. No cranial nerve deficit or sensory deficit. Coordination normal.  GCS eye subscore is 4. GCS verbal subscore is 5. GCS motor subscore is 6.  Skin: Skin is warm, dry and intact. No rash noted. No cyanosis.  Psychiatric: She has a normal mood and affect. Her speech is normal and behavior is normal. Thought content normal.    ED Course  Procedures (including critical care time)  Labs Reviewed  CBC WITH DIFFERENTIAL - Abnormal; Notable for the following:    RBC 3.84 (*)    All other components within normal limits  COMPREHENSIVE METABOLIC PANEL - Abnormal; Notable for the following:    BUN 25 (*)    GFR calc non Af Amer 79 (*)    All other components within normal limits  URINALYSIS, ROUTINE W REFLEX MICROSCOPIC   No results found.   Diagnosis: Myalgias, generalized weakness    MDM  Patient comes to the ER with concerns over a medication she was given by Dr. Allena Napoleon. 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 Since the injection patient has had constellation of vague complaints. She does have a history of migraines and she did have a headache earlier in the week, was treated at an urgent care and that has improved. She is now complaining mainly of fatigue, generalized muscle aches and insomnia. All lab work is unremarkable. Case was discussed briefly with the [facility] poison control toxicologist. He did not feel that there was any further interventions were treatment necessary. He did not feel that the medication was specifically to blame, as it is used with fair regularity in [place]. He recommends formal mercury testing by 24-hour urine collection if the patient is still concerned, no further treatment until that is performed.        Gilda Crease, MD 06/09/12 2240

## 2012-06-09 NOTE — ED Notes (Signed)
Called and spoke with poison control per MD request. dispactcher stated she would inform the toxicologist and get him to speak with MD awaiting call back.

## 2012-10-29 ENCOUNTER — Ambulatory Visit (INDEPENDENT_AMBULATORY_CARE_PROVIDER_SITE_OTHER): Payer: BC Managed Care – PPO | Admitting: Family Medicine

## 2012-10-29 ENCOUNTER — Encounter: Payer: Self-pay | Admitting: Family Medicine

## 2012-10-29 VITALS — BP 110/70 | HR 71 | Temp 97.0°F | Ht 64.75 in | Wt 122.2 lb

## 2012-10-29 DIAGNOSIS — K6289 Other specified diseases of anus and rectum: Secondary | ICD-10-CM

## 2012-10-29 NOTE — Progress Notes (Signed)
Subjective:    Patient ID: Laura Brock, female    DOB: 05/04/1960, 52 y.o.   MRN: [id number]  HPI  52 yo pt of Dr. Alwyn Ren here for ? Hemorrhoids.  Has felt a dull rectal ache x 1 month,  still having some occassional pain. Only lasts a minute or two on most days.  Worse after a bowel movement or sitting for periods of time. Only had bright red blood on toilet paper 1 week ago, has not had it sense.  BMs are a little painful but "not bad."   Did have a colonoscopy 3 years ago- 5 year recall due to family history.  Has had some constipation recently.  Just started taking a stool softener.  Eats a gluten free diet- often has problems with constipation.  No decreased appetite.  No abdominal fullness.  Patient Active Problem List   Diagnosis Date Noted  . Rectal pain 10/29/2012  . HYPOTHYROIDISM 04/25/2008  . IRREGULAR MENSES 04/25/2008  . OSTEOPENIA 04/25/2008  . HEADACHE 04/25/2008  . FRACTURE, FIBULA, LEFT 04/25/2008   Past Medical History  Diagnosis Date  . Allergy   . Thyroid disease   . Migraine    No past surgical history on file. History  Substance Use Topics  . Smoking status: Never Smoker   . Smokeless tobacco: Not on file  . Alcohol Use: No   Family History  Problem Relation Age of Onset  . Cancer Father   . Diabetes Maternal Grandfather   . Heart disease Paternal Grandfather    Allergies  Allergen Reactions  . Penicillins Swelling   Current Outpatient Prescriptions on File Prior to Visit  Medication Sig Dispense Refill  . clonazePAM (KLONOPIN) 0.5 MG tablet Take 0.25 mg by mouth 2 (two) times daily as needed for anxiety.      Marland Kitchen ketoprofen (ORUDIS) 75 MG capsule Take 75 mg by mouth 4 (four) times daily as needed for pain.      . magnesium aspartate (MAGINEX) 615 MG tablet Take 615 mg by mouth at bedtime.       . Multiple Vitamin (MULTIVITAMIN) tablet Take 1 tablet by mouth daily.      . SUMAtriptan (IMITREX) 100 MG tablet Take 100 mg by mouth every 2  (two) hours as needed for migraine.      . thyroid (ARMOUR) 60 MG tablet Take 60 mg by mouth daily.       No current facility-administered medications on file prior to visit.   The PMH, PSH, Social History, Family History, Medications, and allergies have been reviewed in [facility], and have been updated if relevant.    Review of Systems See HPI    Objective:   Physical Exam BP 110/70  Pulse 71  Temp(Src) 97 F (36.1 C) (Oral)  Ht 5' 4.75" (1.645 m)  Wt 122 lb 4 oz (55.452 kg)  BMI 20.49 kg/m2  SpO2 95%  General:  Well-developed,well-nourished,in no acute distress; alert,appropriate and cooperative throughout examination Head:  normocephalic and atraumatic.   Abdomen:  Bowel sounds positive,abdomen soft and non-tender without masses, organomegaly or hernias noted. Rectal:  no external abnormalities.   Small anal fissure Psych:  Cognition and judgment appear intact. Alert and cooperative with normal attention span and concentration. No apparent delusions, illusions, hallucinations      Assessment & Plan:  1. Rectal pain ?anal fissure but I cannot rule out internal hemorrhoids. Will refer to GI. Discussed supportive measures, she is hesitant to try suppositories or ointments at this time.  She is aware that she should call PCP if symptoms worsen. The patient indicates understanding of these issues and agrees with the plan.  - Ambulatory referral to Gastroenterology

## 2012-10-29 NOTE — Patient Instructions (Signed)
Anal Fissure, Adult  An anal fissure is a small tear or crack in the skin around the anus. Bleeding from a fissure usually stops on its own within a few minutes. However, bleeding will often reoccur with each bowel movement until the crack heals.   CAUSES    Passing large, hard stools.   Frequent diarrheal stools.   Constipation.   Inflammatory bowel disease (Crohn's disease or ulcerative colitis).   Infections.   Anal sex.  SYMPTOMS    Small amounts of blood seen on your stools, on toilet paper, or in the toilet after a bowel movement.   Rectal bleeding.   Painful bowel movements.   Itching or irritation around the anus.  DIAGNOSIS  Your caregiver will examine the anal area. An anal fissure can usually be seen with careful inspection. A rectal exam may be performed and a short tube (anoscope) may be used to examine the anal canal.  TREATMENT    You may be instructed to take fiber supplements. These supplements can soften your stool to help make bowel movements easier.   Sitz baths may be recommended to help heal the tear. Do not use soap in the sitz baths.   A medicated cream or ointment may be prescribed to lessen discomfort.  HOME CARE INSTRUCTIONS    Maintain a diet high in fruits, whole grains, and vegetables. Avoid constipating foods like bananas and dairy products.   Take sitz baths as directed by your caregiver.   Drink enough fluids to keep your urine clear or pale yellow.   Only take over-the-counter or prescription medicines for pain, discomfort, or fever as directed by your caregiver. Do not take aspirin as this may increase bleeding.   Do not use ointments containing numbing medications (anesthetics) or hydrocortisone. They could slow healing.  SEEK MEDICAL CARE IF:    Your fissure is not completely healed within 3 days.   You have further bleeding.   You have a fever.   You have diarrhea mixed with blood.   You have pain.   Your problem is getting worse rather than  better.  MAKE SURE YOU:    Understand these instructions.   Will watch your condition.   Will get help right away if you are not doing well or get worse.  Document Released: 03/16/2005 Document Revised: 06/08/2011 Document Reviewed: 08/31/2010  [employer] Patient Information 2014 [employer].

## 2012-11-01 ENCOUNTER — Encounter: Payer: Self-pay | Admitting: Gastroenterology

## 2012-11-02 ENCOUNTER — Telehealth: Payer: Self-pay | Admitting: Internal Medicine

## 2012-11-02 NOTE — Telephone Encounter (Signed)
Patient called stating she was seen by Dr. Dayton Martes [facility] and was treated for an anal fissure. At the time of her visit she was offered suppositories, but turned them down. She has since changed her mind and would like these sent to [facility] on [address].

## 2012-11-02 NOTE — Telephone Encounter (Signed)
Please forward to Dr Dayton Martes ; I 'm not sure what she wanted to Rx; I usually use [facility]

## 2012-11-02 NOTE — Telephone Encounter (Signed)
You have not seen this patient in office since prior to 2012. Would you like me to refer her back to Dr. Jena Gauss?

## 2012-11-03 ENCOUNTER — Telehealth: Payer: Self-pay

## 2012-11-03 ENCOUNTER — Other Ambulatory Visit: Payer: Self-pay | Admitting: Internal Medicine

## 2012-11-03 DIAGNOSIS — K600 Acute anal fissure: Secondary | ICD-10-CM

## 2012-11-03 MED ORDER — HYDROCORTISONE ACETATE 25 MG RE SUPP: 25.0000 mg | Freq: Two times a day (BID) | RECTAL | Status: DC

## 2012-11-03 NOTE — Telephone Encounter (Signed)
Patient presented to lobby requesting medication for an anal fissure that she was seen for a the [facility]. Laura Brock had referred her back to provider from [facility] but she was out of the office until Monday. Advised patient that she had not been seen here in several years so it would be good to have an annual wellness exam. Patient agreed. Rx written for anusol and given.

## 2012-11-04 NOTE — Telephone Encounter (Signed)
This was taken care of yesterday afternoon at [facility] office (see subsequent phone note).

## 2012-11-18 ENCOUNTER — Ambulatory Visit: Payer: BC Managed Care – PPO | Admitting: Gastroenterology

## 2012-12-07 ENCOUNTER — Encounter: Payer: Self-pay | Admitting: Gastroenterology

## 2012-12-21 ENCOUNTER — Encounter: Payer: Self-pay | Admitting: *Deleted

## 2013-01-03 ENCOUNTER — Ambulatory Visit: Payer: BC Managed Care – PPO | Admitting: Gastroenterology

## 2013-01-25 ENCOUNTER — Ambulatory Visit (INDEPENDENT_AMBULATORY_CARE_PROVIDER_SITE_OTHER): Payer: BC Managed Care – PPO | Admitting: Family Medicine

## 2013-01-25 VITALS — BP 100/60 | HR 77 | Temp 98.0°F | Resp 16 | Ht 65.5 in | Wt 121.0 lb

## 2013-01-25 DIAGNOSIS — G43901 Migraine, unspecified, not intractable, with status migrainosus: Secondary | ICD-10-CM

## 2013-01-25 MED ORDER — PROMETHAZINE HCL 25 MG/ML IJ SOLN: 25.0000 mg | Freq: Four times a day (QID) | INTRAMUSCULAR | Status: DC | PRN

## 2013-01-25 MED ORDER — KETOROLAC TROMETHAMINE 60 MG/2ML IM SOLN
60.0000 mg | Freq: Once | INTRAMUSCULAR | Status: AC
  Administered 2013-01-25: 60 mg via INTRAMUSCULAR

## 2013-01-25 MED ORDER — PROMETHAZINE HCL 25 MG/ML IJ SOLN
25.0000 mg | Freq: Once | INTRAMUSCULAR | Status: AC
  Administered 2013-01-25: 25 mg via INTRAMUSCULAR

## 2013-01-25 NOTE — Patient Instructions (Addendum)
Thank you for coming in today  Continue your prednisone dose pak We gave you phenergan and toradol here today  Start riboflavin 400 mg daily Start butterbur PETADOLEX brand 75 mg 2 x per day  Migraine Headache A migraine headache is an intense, throbbing pain on one or both sides of your head. A migraine can last for 30 minutes to several hours. CAUSES  The exact cause of a migraine headache is not always known. However, a migraine may be caused when nerves in the brain become irritated and release chemicals that cause inflammation. This causes pain. SYMPTOMS  Pain on one or both sides of your head.  Pulsating or throbbing pain.  Severe pain that prevents daily activities.  Pain that is aggravated by any physical activity.  Nausea, vomiting, or both.  Dizziness.  Pain with exposure to bright lights, loud noises, or activity.  General sensitivity to bright lights, loud noises, or smells. Before you get a migraine, you may get warning signs that a migraine is coming (aura). An aura may include:  Seeing flashing lights.  Seeing bright spots, halos, or zig-zag lines.  Having tunnel vision or blurred vision.  Having feelings of numbness or tingling.  Having trouble talking.  Having muscle weakness. MIGRAINE TRIGGERS  Alcohol.  Smoking.  Stress.  Menstruation.  Aged cheeses.  Foods or drinks that contain nitrates, glutamate, aspartame, or tyramine.  Lack of sleep.  Chocolate.  Caffeine.  Hunger.  Physical exertion.  Fatigue.  Medicines used to treat chest pain (nitroglycerine), birth control pills, estrogen, and some blood pressure medicines. DIAGNOSIS  A migraine headache is often diagnosed based on:  Symptoms.  Physical examination.  A CT scan or MRI of your head. TREATMENT Medicines may be given for pain and nausea. Medicines can also be given to help prevent recurrent migraines.  HOME CARE INSTRUCTIONS  Only take over-the-counter or  prescription medicines for pain or discomfort as directed by your caregiver. The use of long-term narcotics is not recommended.  Lie down in a dark, quiet room when you have a migraine.  Keep a journal to find out what may trigger your migraine headaches. For example, write down:  What you eat and drink.  How much sleep you get.  Any change to your diet or medicines.  Limit alcohol consumption.  Quit smoking if you smoke.  Get 7 to 9 hours of sleep, or as recommended by your caregiver.  Limit stress.  Keep lights dim if bright lights bother you and make your migraines worse. SEEK IMMEDIATE MEDICAL CARE IF:   Your migraine becomes severe.  You have a fever.  You have a stiff neck.  You have vision loss.  You have muscular weakness or loss of muscle control.  You start losing your balance or have trouble walking.  You feel faint or pass out.  You have severe symptoms that are different from your first symptoms. MAKE SURE YOU:   Understand these instructions.  Will watch your condition.  Will get help right away if you are not doing well or get worse. Document Released: 03/16/2005 Document Revised: 06/08/2011 Document Reviewed: 03/06/2011 [facility] Patient Information 2014 [place], [place].

## 2013-01-25 NOTE — Progress Notes (Signed)
  Subjective:    Patient ID: Laura Brock, female    DOB: 01-24-1961, 52 y.o.   MRN: [id number]  HPI Patient presents with headache for last 3 weeks. Headache worsened Friday. Took Imitrex and did not improve. Had persistent migraine symptoms for last several days. Has taken imitrex and nsaids many days. Does have history of migraines requiring ER visit. Tried benadryl last 2 nights. MD previously gave prednisone and patient started it this morning with 50 mg. Tried to go to work and headache worsened during day at work.   Headaches are similar to previous migrainous episodes. No fever, persistent vomiting, balance problems, abnormal neurologic signs.  Pain is exacerbated by movement. Has nausea. Sensitivity to light and sound.   Review of Systems  All other systems reviewed and are negative.       Objective:   Physical Exam  Constitutional: She is oriented to person, place, and time. She appears well-developed and well-nourished. She appears distressed.  HENT:  Head: Normocephalic and atraumatic.  Mouth/Throat: Oropharynx is clear and moist.  Eyes: EOM are normal. Pupils are equal, round, and reactive to light. No scleral icterus.  Neck: Neck supple.  Cardiovascular: Normal rate, regular rhythm and normal heart sounds.   No murmur heard. Pulmonary/Chest: Effort normal and breath sounds normal.  Abdominal: Soft.  Musculoskeletal: Normal range of motion.  Lymphadenopathy:    She has no cervical adenopathy.  Neurological: She is alert and oriented to person, place, and time. No cranial nerve deficit. Coordination normal.  Skin: Skin is warm and dry.  Psychiatric: She has a normal mood and affect. Her behavior is normal.  Patient is in dark room. She is in moderate distress.    Assessment & Plan:  #1. Migraine headache with status migrainous - Headache is similar to previous episodes with no red flag symptoms, do no suspect serious intracranial pathology. Exam normal. No need for  advanced imaging - Responded well to combo of decadron, phenergan, and toradol at last visit. She has already started PO steroids today. Will give IM phenergan and toradol today. - Complete dose pak of steroids - Start riboflavin 400 mg daily and butterbur (petadolex)  75 mg bid for migraine prophylaxis. - Return precautions.  ADDENDUM: Patient feeling much better after above treatments. Discharged home with instructions and return precautions.

## 2013-01-26 ENCOUNTER — Telehealth: Payer: Self-pay

## 2013-01-26 MED ORDER — PROMETHAZINE HCL 25 MG PO TABS: 25.0000 mg | ORAL_TABLET | Freq: Four times a day (QID) | ORAL | Status: DC | PRN

## 2013-01-26 NOTE — Telephone Encounter (Signed)
Patient was in last night for a migrane, she is still having some nausea and would like to know if we can call in phenergan for her if possible [phone]

## 2013-01-26 NOTE — Telephone Encounter (Signed)
Phenergan sent to pharmacy

## 2013-01-27 NOTE — Telephone Encounter (Signed)
Called her to advise.  

## 2013-02-02 ENCOUNTER — Ambulatory Visit (INDEPENDENT_AMBULATORY_CARE_PROVIDER_SITE_OTHER): Payer: BC Managed Care – PPO | Admitting: Physician Assistant

## 2013-02-02 VITALS — BP 108/60 | HR 84 | Temp 99.2°F | Resp 16 | Ht 65.0 in | Wt 123.0 lb

## 2013-02-02 DIAGNOSIS — G43901 Migraine, unspecified, not intractable, with status migrainosus: Secondary | ICD-10-CM

## 2013-02-02 MED ORDER — METHYLPREDNISOLONE SODIUM SUCC 125 MG IJ SOLR
125.0000 mg | Freq: Once | INTRAMUSCULAR | Status: AC
  Administered 2013-02-02: 125 mg via INTRAMUSCULAR

## 2013-02-02 MED ORDER — LIDOCAINE HCL 4 % EX SOLN: CUTANEOUS | Status: DC | PRN

## 2013-02-02 MED ORDER — PROMETHAZINE HCL 25 MG/ML IJ SOLN
25.0000 mg | Freq: Four times a day (QID) | INTRAMUSCULAR | Status: DC | PRN
  Administered 2013-02-02: 25 mg via INTRAMUSCULAR

## 2013-02-02 MED ORDER — KETOROLAC TROMETHAMINE 60 MG/2ML IM SOLN
60.0000 mg | Freq: Once | INTRAMUSCULAR | Status: AC
  Administered 2013-02-02: 60 mg via INTRAMUSCULAR

## 2013-02-02 NOTE — Progress Notes (Signed)
Subjective:    Patient ID: Laura Brock, female    DOB: 10-Mar-1961, 52 y.o.   MRN: [id number]  Migraine  Associated symptoms include nausea. Pertinent negatives include no abdominal pain, dizziness, numbness or weakness.   Laura Brock is a 52 YO female with a history of migraines presents today with migraine that has been ongoing since October 24th.  Since the onset of this particular migraine, the patient has been seen by multiple providers and has been treated with Toradol injections, Phenergan, a prednisone taper, multiple Imitrex doses, trigger point injections by her HA specialist, chiropractor manipulations, and a muscle relaxer that have all produced temporary relief but the migraine however has continued.    The patient describes the headache as a 7/10 in severity, located in the temples bilaterally and across the forehead which is consistent with previous migraines.  Other than the duration, there are no atypical symptoms for her. The patient has phonophobia, photophobia, and intermittent nausea.  This ongoing migraine has caused her to not be able to rest well but states the Flexeril did help with her sleep.    She has made multiple dietary changes over the past few years including removing dairy, caffeine, and gluten from her diet and does not attribute this particular migraine to changes in her diet. She attributes these changes under the guidance of Dr. Allena Napoleon, to a significant improvement in her HA frequency, such that she has not really had a "bad" HA in 18-24 months. She has a prescription for a butalbital nasal spray from Dr. Alessandra Bevels, but hasn't used it yet.  She's nervous trying new things.  The patient was taking progesterone previously but was having side effects including decreased mood and constipation and decided to stop taking the medication on August 15th.  Since this time, the patient states she has had increased migraine frequency and severity and is concerned that her  current headache may be hormonally related.    She has an appointment tomorrow at [facility].   Past Medical History  Diagnosis Date  . Allergy   . Thyroid disease   . Migraine   . History of anal fissures       Review of Systems  Constitutional: Positive for activity change and fatigue. Negative for chills.  Cardiovascular: Positive for leg swelling. Negative for chest pain.  Gastrointestinal: Positive for nausea. Negative for abdominal pain.  Neurological: Positive for headaches. Negative for dizziness, syncope, speech difficulty, weakness, light-headedness and numbness.  Psychiatric/Behavioral: Positive for sleep disturbance. The patient is nervous/anxious.        Objective:   Physical Exam  Vitals reviewed. Constitutional: She is oriented to person, place, and time. She appears well-developed and well-nourished. She appears distressed (lying still in a darkened room, wearing sunglasses).  HENT:  Head: Normocephalic and atraumatic.  Nose: Nose normal.  Mouth/Throat: Oropharynx is clear and moist. No oropharyngeal exudate.  Eyes: Conjunctivae and EOM are normal. Pupils are equal, round, and reactive to light. Right eye exhibits no discharge. Left eye exhibits no discharge. No scleral icterus.  Neck: Normal range of motion. Neck supple. No thyromegaly present.  Cardiovascular: Normal rate, regular rhythm, normal heart sounds and intact distal pulses.   Pulmonary/Chest: Effort normal and breath sounds normal.  Abdominal: Soft. Bowel sounds are normal. There is no tenderness.  Lymphadenopathy:    She has no cervical adenopathy.  Neurological: She is alert and oriented to person, place, and time. She has normal strength and normal reflexes. She  displays no tremor and normal reflexes. No cranial nerve deficit or sensory deficit. She exhibits normal muscle tone. She displays no seizure activity. Coordination and gait normal.  Skin: Skin is warm and dry.   Psychiatric: She has a normal mood and affect. Her behavior is normal. Thought content normal.      BP 108/60  Pulse 84  Temp(Src) 99.2 F (37.3 C)  Resp 16  Ht 5\' 5"  (1.651 m)  Wt 123 lb (55.792 kg)  BMI 20.47 kg/m2     Assessment & Plan:  Migraine with status migrainosus - Plan: ketorolac (TORADOL) injection 60 mg, promethazine (PHENERGAN) injection 25 mg, lidocaine (XYLOCAINE) 4 % external solution, methylPREDNISolone sodium succinate (SOLU-MEDROL) 125 mg/2 mL injection 125 mg  Patient Instructions  You may use the butalbital from Dr. Alessandra Bevels.  The nasal drops (lidocaine) can be used along with other oral agents. To use: Lie down with a pillow under your shoulders to slightly hyperextend your neck.  Tilt your head to the RIGHT, and instill 0.25 ml of the lidocaine into the LEFT nostril, aiming toward the bridge of the nose.  Repeat in the RIGHT nostril with your head turned to the LEFT.  Try taking 10 mg of flexeril (cyclobenzaprine) at night, to help you sleep.

## 2013-02-02 NOTE — Patient Instructions (Signed)
You may use the butalbital from Dr. Alessandra Bevels.  The nasal drops (lidocaine) can be used along with other oral agents. To use: Lie down with a pillow under your shoulders to slightly hyperextend your neck.  Tilt your head to the RIGHT, and instill 0.25 ml of the lidocaine into the LEFT nostril, aiming toward the bridge of the nose.  Repeat in the RIGHT nostril with your head turned to the LEFT.  Try taking 10 mg of flexeril (cyclobenzaprine) at night, to help you sleep.

## 2013-02-03 NOTE — Progress Notes (Signed)
I have examined this patient along with the student and agree.  

## 2013-03-20 ENCOUNTER — Encounter: Payer: BC Managed Care – PPO | Admitting: Internal Medicine

## 2013-04-18 ENCOUNTER — Encounter: Payer: BC Managed Care – PPO | Admitting: Internal Medicine

## 2013-06-12 ENCOUNTER — Encounter: Payer: BC Managed Care – PPO | Admitting: Family Medicine

## 2013-07-28 LAB — HM MAMMOGRAPHY: HM Mammogram: NORMAL

## 2013-07-28 LAB — HM PAP SMEAR: HM Pap smear: NORMAL

## 2013-08-15 ENCOUNTER — Telehealth: Payer: Self-pay

## 2013-08-15 NOTE — Telephone Encounter (Signed)
Left message for call back identifiable  No pertinent data

## 2013-08-16 ENCOUNTER — Encounter: Payer: BC Managed Care – PPO | Admitting: Family Medicine

## 2013-08-16 DIAGNOSIS — Z0289 Encounter for other administrative examinations: Secondary | ICD-10-CM

## 2013-08-18 NOTE — Telephone Encounter (Signed)
Unable to reach prior to visit  

## 2013-09-27 ENCOUNTER — Ambulatory Visit (INDEPENDENT_AMBULATORY_CARE_PROVIDER_SITE_OTHER): Payer: BC Managed Care – PPO | Admitting: Family Medicine

## 2013-09-27 VITALS — BP 104/68 | HR 73 | Temp 98.4°F | Resp 16 | Ht 65.5 in | Wt 126.0 lb

## 2013-09-27 DIAGNOSIS — G43011 Migraine without aura, intractable, with status migrainosus: Secondary | ICD-10-CM

## 2013-09-27 DIAGNOSIS — R51 Headache: Secondary | ICD-10-CM

## 2013-09-27 DIAGNOSIS — E039 Hypothyroidism, unspecified: Secondary | ICD-10-CM

## 2013-09-27 MED ORDER — PROMETHAZINE HCL 25 MG/ML IJ SOLN
25.0000 mg | Freq: Once | INTRAMUSCULAR | Status: AC
  Administered 2013-09-27: 25 mg via INTRAMUSCULAR

## 2013-09-27 MED ORDER — METHYLPREDNISOLONE SODIUM SUCC 125 MG IJ SOLR
125.0000 mg | Freq: Once | INTRAMUSCULAR | Status: AC
  Administered 2013-09-27: 125 mg via INTRAMUSCULAR

## 2013-09-27 MED ORDER — KETOROLAC TROMETHAMINE 60 MG/2ML IM SOLN
60.0000 mg | Freq: Once | INTRAMUSCULAR | Status: AC
  Administered 2013-09-27: 60 mg via INTRAMUSCULAR

## 2013-09-27 NOTE — Progress Notes (Signed)
Subjective:    Patient ID: Laura Brock, female    DOB: Apr 16, 1960, 53 y.o.   MRN: [id number] This chart was scribed for Delman Cheadle, MD by Vernell Barrier, Medical Scribe. The patient was seen in room 1. This patient's care was started at 8:35 PM.  Chief Complaint  Patient presents with  . Migraine    started yesterday---meds are not helping her.      HPI HPI Comments: Laura Brock is a 53 y.o. female w/ hx frequent migraine HA presents to the [facility] with gradually worsening migraine; onset 1 day prior. Treated prior with Magnexium, Relpax, Promethazine, Lidocaine, Imitrex, Flexeril, and Klonopin. Seen at our clinic periodically with intractable migraines approximately twice a year. Seen last November 2014. Follow at [facility] and Dr. Adriana Simas. In the past she has had Toridol injection, Prednisone tapers, trigger point injection, chiropractic manipulations which also temporary relief of migraine. Several dietary changes including diary, gluten, and caffeine removal.  After last visit, started on Butalbital nasal spray from Dr. Sharol Roussel. At last office visit here she was treated with Toridol 60 IM, Promethazol 25 IM and Solumedrol 125 IM.   States this migraine is more temporal and frontal in comparison to her prior migraines. Associated photophobia. Denies oras.   Hx of high antibodies. Recently changed to Westhroid hypothyroid medication. Was taking Armor for the past 3 years. Started taking 5 days ago. Believes this change in medication is responsible for current migraine. Has not taken medication today. Started feeling bad yesterday. Went to work as normal but gradually worsened over the day. Took upon coming home Relpax, Flexiril, and Ketoprofen with no relief. Took Tylenol approximately 5 hours ago. Has not taken Ketoprofen today. States Relpax had been working very well prior to starting new thyroid medication; was taking once monthly.  Had not had acute episodes in months. States she usually has hormonal migraines. HAs initially started around 53 years of age primarily around menstrual cycles. Tests completed at that time showed patient was very low on estrogen and progesterone. Prescribed progesterone and states that has also been assisting in migraines.   Patient Active Problem List   Diagnosis Date Noted  . Rectal pain 10/29/2012  . HYPOTHYROIDISM 04/25/2008  . IRREGULAR MENSES 04/25/2008  . OSTEOPENIA 04/25/2008  . HEADACHE 04/25/2008  . FRACTURE, FIBULA, LEFT 04/25/2008   Past Medical History  Diagnosis Date  . Allergy   . Thyroid disease   . Migraine   . History of anal fissures    No past surgical history on file. Allergies  Allergen Reactions  . Penicillins Swelling   Prior to Admission medications   Medication Sig Start Date End Date Taking? Authorizing Provider  clonazePAM (KLONOPIN) 0.5 MG tablet Take 0.25 mg by mouth 2 (two) times daily as needed for anxiety.   Yes Historical Provider, MD  cyclobenzaprine (FLEXERIL) 10 MG tablet Take 10 mg by mouth as needed for muscle spasms.   Yes Historical Provider, MD  eletriptan (RELPAX) 40 MG tablet Take 40 mg by mouth as needed for migraine or headache. One tablet by mouth at onset of headache. May repeat in 2 hours if headache persists or recurs.   Yes Historical Provider, MD  ketoprofen (ORUDIS) 75 MG capsule Take 75 mg by mouth 4 (four) times daily as needed for pain.   Yes Historical Provider, MD  lidocaine (XYLOCAINE) 4 % external solution Apply topically as needed. Instill 0.25 ml in each nare prn  migraine HA. 02/02/13  Yes Chelle S Jeffery, PA-C  magnesium aspartate (MAGINEX) 615 MG tablet Take 615 mg by mouth at bedtime.    Yes Historical Provider, MD  Multiple Vitamin (MULTIVITAMIN) tablet Take 1 tablet by mouth daily.   Yes Historical Provider, MD  promethazine (PHENERGAN) 25 MG tablet Take 1 tablet (25 mg total) by mouth every 6 (six) hours as needed  for nausea. 01/26/13  Yes Theda Sers, PA-C  SUMAtriptan (IMITREX) 100 MG tablet Take 100 mg by mouth every 2 (two) hours as needed for migraine.   Yes Historical Provider, MD  thyroid (ARMOUR) 60 MG tablet Take 60 mg by mouth daily.   Yes Historical Provider, MD   History   Social History  . Marital Status: Married    Spouse Name: N/A    Number of Children: N/A  . Years of Education: N/A   Occupational History  . Not on file.   Social History Main Topics  . Smoking status: Never Smoker   . Smokeless tobacco: Not on file  . Alcohol Use: No  . Drug Use: Not on file  . Sexual Activity: Not on file   Other Topics Concern  . Not on file   Social History Narrative  . No narrative on file   Review of Systems  Eyes: Positive for photophobia. Negative for visual disturbance.  Neurological: Positive for headaches. Negative for light-headedness.   BP 104/68  Pulse 73  Temp(Src) 98.4 F (36.9 C) (Oral)  Resp 16  Ht 5' 5.5" (1.664 m)  Wt 126 lb (57.153 kg)  BMI 20.64 kg/m2  SpO2 100% Objective:  Physical Exam  Vitals reviewed. Constitutional: She is oriented to person, place, and time. She appears well-developed and well-nourished. No distress.  HENT:  Head: Normocephalic and atraumatic.  Nose: Mucosal edema present.  Eyes: Conjunctivae and EOM are normal. Pupils are equal, round, and reactive to light.  Neck: Neck supple. No tracheal deviation present.  Cardiovascular: Normal rate and regular rhythm.  Exam reveals no gallop and no friction rub.   No murmur heard. Pulmonary/Chest: Effort normal and breath sounds normal. No respiratory distress. She has no wheezes. She has no rales.  Musculoskeletal: Normal range of motion.  Lymphadenopathy:    She has cervical adenopathy (mild, anterior).  Neurological: She is alert and oriented to person, place, and time.  Neurologically intact. No sensory or motor deficits appreciated bilaterally.   Skin: Skin is warm and dry.    Psychiatric: She has a normal mood and affect. Her behavior is normal.    Assessment & Plan:  Toradol 60 IM, Promethazine 25 IM and Solumedrol 125 IM in clinic.  HEADACHE - Plan: ketorolac (TORADOL) injection 60 mg, methylPREDNISolone sodium succinate (SOLU-MEDROL) 125 mg/2 mL injection 125 mg, promethazine (PHENERGAN) injection 25 mg  HYPOTHYROIDISM  Intractable migraine without aura and with status migrainosus  Meds ordered this encounter  Medications  . eletriptan (RELPAX) 40 MG tablet    Sig: Take 40 mg by mouth as needed for migraine or headache. One tablet by mouth at onset of headache. May repeat in 2 hours if headache persists or recurs.  Marland Kitchen ketorolac (TORADOL) injection 60 mg    Sig:   . methylPREDNISolone sodium succinate (SOLU-MEDROL) 125 mg/2 mL injection 125 mg    Sig:   . promethazine (PHENERGAN) injection 25 mg    Sig:     I personally performed the services described in this documentation, which was scribed in my presence. The recorded information  has been reviewed and considered, and addended by me as needed.  Delman Cheadle, MD MPH

## 2014-02-01 ENCOUNTER — Encounter: Payer: BC Managed Care – PPO | Admitting: Family Medicine

## 2014-02-01 ENCOUNTER — Telehealth: Payer: Self-pay | Admitting: *Deleted

## 2014-02-01 DIAGNOSIS — Z0289 Encounter for other administrative examinations: Secondary | ICD-10-CM

## 2014-02-01 NOTE — Telephone Encounter (Signed)
See note below

## 2014-02-01 NOTE — Telephone Encounter (Signed)
Pt did not show for appointment 02/01/2014 at 10am for CPE

## 2014-02-01 NOTE — Telephone Encounter (Signed)
Pt needs no-show fee 

## 2014-06-14 ENCOUNTER — Telehealth: Payer: Self-pay | Admitting: Internal Medicine

## 2014-06-14 NOTE — Telephone Encounter (Signed)
Pre Visit letter sent  °

## 2014-07-03 ENCOUNTER — Telehealth: Payer: Self-pay | Admitting: *Deleted

## 2014-07-03 ENCOUNTER — Encounter: Payer: Self-pay | Admitting: *Deleted

## 2014-07-03 NOTE — Telephone Encounter (Signed)
Pre-Visit Call completed with patient and chart updated.   Pre-Visit Info documented in Specialty Comments under SnapShot.    

## 2014-07-04 ENCOUNTER — Ambulatory Visit (INDEPENDENT_AMBULATORY_CARE_PROVIDER_SITE_OTHER): Payer: BC Managed Care – PPO | Admitting: Family Medicine

## 2014-07-04 ENCOUNTER — Encounter: Payer: Self-pay | Admitting: Family Medicine

## 2014-07-04 VITALS — BP 106/70 | HR 68 | Temp 98.1°F | Resp 16 | Ht 65.75 in | Wt 134.1 lb

## 2014-07-04 DIAGNOSIS — E038 Other specified hypothyroidism: Secondary | ICD-10-CM

## 2014-07-04 DIAGNOSIS — Z91018 Allergy to other foods: Secondary | ICD-10-CM | POA: Diagnosis not present

## 2014-07-04 DIAGNOSIS — K9041 Non-celiac gluten sensitivity: Secondary | ICD-10-CM

## 2014-07-04 DIAGNOSIS — N926 Irregular menstruation, unspecified: Secondary | ICD-10-CM | POA: Diagnosis not present

## 2014-07-04 NOTE — Progress Notes (Signed)
   Subjective:    Patient ID: Laura Brock, female    DOB: [birth year], 54 y.o.   MRN: [id number]  HPI New to establish.  Previous MD- Linna Darner but has been seeing Dr Lollie Sails (Integrative medicine), Dr Melinda Crutch (GYN), Dr Orvil Feil [facility]).  UTD on colonoscopy- due for repeat w/ Dr Shana Chute ([facility] due to family hx)  Hypothyroid- pt has hx of Hashimoto's. pt reports she saw a 'thyroid specialist' and was started on [facility] Thyroid after 'a tough year'.  Gained 10 lbs, 'felt terrible'.  Now taking [facility] thyroid every other day- managed by Dr Deirdre Pippins.    Migraine- chronic problem.  Pt reports she had 'total systemic inflammation' due to gluten/dairy intolerance.  Since changing diet, HAs have improved.  Will take Relpax as needed.  Hormone replacement- pt has been seeing Dr Deirdre Pippins and Dr Harrington Challenger.  Is intolerant to estrogen due to migraines.  Has been using unopposed progesterone.  Pt plans to establish w/ Dr Helane Rima due to Dr Alan Ripper retirement.  Review of Systems For ROS see HPI     Objective:   Physical Exam  Constitutional: She is oriented to person, place, and time. She appears well-developed and well-nourished. No distress.  HENT:  Head: Normocephalic and atraumatic.  Eyes: Conjunctivae and EOM are normal. Pupils are equal, round, and reactive to light.  Neck: Normal range of motion. Neck supple. No thyromegaly present.  Cardiovascular: Normal rate, regular rhythm, normal heart sounds and intact distal pulses.   No murmur heard. Pulmonary/Chest: Effort normal and breath sounds normal. No respiratory distress.  Abdominal: Soft. She exhibits no distension. There is no tenderness.  Musculoskeletal: She exhibits no edema.  Lymphadenopathy:    She has no cervical adenopathy.  Neurological: She is alert and oriented to person, place, and time.  Skin: Skin is warm and dry.  Psychiatric: She has a normal mood and affect. Her behavior is normal.  Vitals reviewed.         Assessment &  Plan:

## 2014-07-04 NOTE — Patient Instructions (Signed)
Schedule your complete physical in 6 months Call and schedule your appointment w/ Dr Helane Rima- she's great!  She'll help you figure out the hormones Call with any questions or concerns Welcome!  We're glad to have you!

## 2014-07-04 NOTE — Progress Notes (Signed)
Pre visit review using our clinic review tool, if applicable. No additional management support is needed unless otherwise documented below in the visit note. 

## 2014-07-08 NOTE — Assessment & Plan Note (Signed)
Chronic problem for pt.  Pt reports this was the trigger for her migraines and sxs have improved since she made dramatic dietary changes.

## 2014-07-08 NOTE — Assessment & Plan Note (Signed)
Ongoing issue for pt.  Was taking bioidentical hormones from naturopath but is interested in making changes.  Encouraged her to discuss this w/ Dr Helane Rima at upcoming GYN appt.

## 2014-07-08 NOTE — Assessment & Plan Note (Signed)
New to provider, ongoing for pt.  She has opted not to follow w/ Endo or PCP but has decided to have tx w/ Naturopath.  She has had recent thyroid labs that still show elevated TSH.  Will not adjust meds as pt has f/u scheduled w/ other provider.  Will follow along.

## 2014-07-18 ENCOUNTER — Ambulatory Visit (INDEPENDENT_AMBULATORY_CARE_PROVIDER_SITE_OTHER): Payer: BC Managed Care – PPO | Admitting: Family Medicine

## 2014-07-18 ENCOUNTER — Ambulatory Visit (INDEPENDENT_AMBULATORY_CARE_PROVIDER_SITE_OTHER): Payer: BC Managed Care – PPO

## 2014-07-18 DIAGNOSIS — M6248 Contracture of muscle, other site: Secondary | ICD-10-CM

## 2014-07-18 DIAGNOSIS — S134XXA Sprain of ligaments of cervical spine, initial encounter: Secondary | ICD-10-CM

## 2014-07-18 DIAGNOSIS — M62838 Other muscle spasm: Secondary | ICD-10-CM

## 2014-07-18 MED ORDER — ACETAMINOPHEN 500 MG PO TABS
1000.0000 mg | ORAL_TABLET | Freq: Three times a day (TID) | ORAL | Status: DC | PRN
Start: 2014-07-18 — End: 2015-02-18

## 2014-07-18 MED ORDER — CYCLOBENZAPRINE HCL 5 MG PO TABS: 2.5000 mg | ORAL_TABLET | Freq: Three times a day (TID) | ORAL | Status: DC | PRN
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
## 2014-07-19 NOTE — Progress Notes (Signed)
Patient ID: Laura Brock, female   DOB: [birth year], 54 y.o.   MRN: [id number] Reviewed documentation and xray and agree w/ assessment and plan. Delman Cheadle, MD MPH

## 2014-11-03 LAB — HM PAP SMEAR: HM Pap smear: NORMAL

## 2014-11-03 LAB — HM MAMMOGRAPHY

## 2015-01-02 ENCOUNTER — Telehealth: Payer: Self-pay

## 2015-01-02 NOTE — Telephone Encounter (Signed)
LMOVM

## 2015-01-03 ENCOUNTER — Encounter (INDEPENDENT_AMBULATORY_CARE_PROVIDER_SITE_OTHER): Payer: Self-pay

## 2015-01-03 ENCOUNTER — Ambulatory Visit (INDEPENDENT_AMBULATORY_CARE_PROVIDER_SITE_OTHER): Payer: BC Managed Care – PPO | Admitting: Family Medicine

## 2015-01-03 ENCOUNTER — Encounter: Payer: Self-pay | Admitting: Family Medicine

## 2015-01-03 VITALS — BP 102/68 | HR 64 | Temp 98.1°F | Resp 17 | Ht 66.0 in | Wt 133.2 lb

## 2015-01-03 DIAGNOSIS — Z0000 Encounter for general adult medical examination without abnormal findings: Secondary | ICD-10-CM

## 2015-01-03 LAB — BASIC METABOLIC PANEL
BUN: 19 mg/dL (ref 6–23)
CO2: 29 mEq/L (ref 19–32)
Calcium: 9.4 mg/dL (ref 8.4–10.5)
Chloride: 105 mEq/L (ref 96–112)
Creatinine, Ser: 0.8 mg/dL (ref 0.40–1.20)
GFR: 79.05 mL/min (ref >60.00)
Glucose, Bld: 92 mg/dL (ref 70–99)
POTASSIUM: 4.2 meq/L (ref 3.5–5.1)
SODIUM: 140 meq/L (ref 135–145)

## 2015-01-03 LAB — VITAMIN D 25 HYDROXY (VIT D DEFICIENCY, FRACTURES): VITD: 68.55 ng/mL (ref 30.00–100.00)

## 2015-01-03 LAB — CBC WITH DIFFERENTIAL/PLATELET
Basophils Absolute: 0 10*3/uL (ref 0.0–0.1)
Basophils Relative: 0.5 % (ref 0.0–3.0)
EOS PCT: 2.1 % (ref 0.0–5.0)
Eosinophils Absolute: 0.1 10*3/uL (ref 0.0–0.7)
HCT: 39.1 % (ref 36.0–46.0)
Hemoglobin: 13.2 g/dL (ref 12.0–15.0)
LYMPHS ABS: 1.1 10*3/uL (ref 0.7–4.0)
Lymphocytes Relative: 29.1 % (ref 12.0–46.0)
MCHC: 33.7 g/dL (ref 30.0–36.0)
MCV: 93.7 fl (ref 78.0–100.0)
MONOS PCT: 13 % — AB (ref 3.0–12.0)
Monocytes Absolute: 0.5 10*3/uL (ref 0.1–1.0)
NEUTROS ABS: 2.1 10*3/uL (ref 1.4–7.7)
NEUTROS PCT: 55.3 % (ref 43.0–77.0)
Platelets: 164 10*3/uL (ref 150.0–400.0)
RBC: 4.18 Mil/uL (ref 3.87–5.11)
RDW: 13 % (ref 11.5–15.5)
WBC: 3.9 10*3/uL — ABNORMAL LOW (ref 4.0–10.5)

## 2015-01-03 LAB — LIPID PANEL
CHOL/HDL RATIO: 3
CHOLESTEROL: 176 mg/dL (ref 0–200)
HDL: 64.9 mg/dL (ref >39.00)
LDL CALC: 100 mg/dL — AB (ref 0–99)
NonHDL: 111.16
Triglycerides: 56 mg/dL (ref 0.0–149.0)
VLDL: 11.2 mg/dL (ref 0.0–40.0)

## 2015-01-03 LAB — HEPATIC FUNCTION PANEL
ALK PHOS: 58 U/L (ref 39–117)
ALT: 24 U/L (ref 0–35)
AST: 25 U/L (ref 0–37)
Albumin: 4.6 g/dL (ref 3.5–5.2)
BILIRUBIN DIRECT: 0.1 mg/dL (ref 0.0–0.3)
BILIRUBIN TOTAL: 0.6 mg/dL (ref 0.2–1.2)
Total Protein: 7.4 g/dL (ref 6.0–8.3)

## 2015-01-03 LAB — TSH: TSH: 6.5 u[IU]/mL — ABNORMAL HIGH (ref 0.35–4.50)

## 2015-01-03 NOTE — Patient Instructions (Signed)
Follow up in 1 year or as needed We'll notify you of your lab results and make any changes if needed Keep up the good work on healthy diet and regular exercise- you look great! Call with any questions or concern If you want to join [place] at the new [place] office, any scheduled appointments will automatically transfer and we will see you at [address] [place] [address] [place], [place] [address]  Happy Fall!!!

## 2015-01-03 NOTE — Progress Notes (Signed)
Pre visit review using our clinic review tool, if applicable. No additional management support is needed unless otherwise documented below in the visit note. 

## 2015-01-03 NOTE — Assessment & Plan Note (Signed)
Pt's PE WNL.  UTD on GYN.  Due for colonoscopy next year ([facility]).  Check labs.  Flu shot declined.  Anticipatory guidance provided.

## 2015-01-03 NOTE — Progress Notes (Signed)
   Subjective:    Patient ID: Laura Brock, female    DOB: 1960-06-10, 54 y.o.   MRN: [id number]  HPI CPE- UTD on GYN (pap 7/16, mammo).  Colonoscopy due next year.   Review of Systems Patient reports no vision/ hearing changes, adenopathy,fever, weight change,  persistant/recurrent hoarseness , swallowing issues, chest pain, palpitations, edema, persistant/recurrent cough, hemoptysis, dyspnea (rest/exertional/paroxysmal nocturnal), gastrointestinal bleeding (melena, rectal bleeding), abdominal pain, significant heartburn, bowel changes, GU symptoms (dysuria, hematuria, incontinence), Gyn symptoms (abnormal  bleeding, pain),  syncope, focal weakness, memory loss, numbness & tingling, skin/hair/nail changes, abnormal bruising or bleeding, anxiety, or depression.     Objective:   Physical Exam General Appearance:    Alert, cooperative, no distress, appears stated age  Head:    Normocephalic, without obvious abnormality, atraumatic  Eyes:    PERRL, conjunctiva/corneas clear, EOM's intact, fundi    benign, both eyes  Ears:    Normal TM's and external ear canals, both ears  Nose:   Nares normal, septum midline, mucosa normal, no drainage    or sinus tenderness  Throat:   Lips, mucosa, and tongue normal; teeth and gums normal  Neck:   Supple, symmetrical, trachea midline, no adenopathy;    Thyroid: no enlargement/tenderness/nodules  Back:     Symmetric, no curvature, ROM normal, no CVA tenderness  Lungs:     Clear to auscultation bilaterally, respirations unlabored  Chest Wall:    No tenderness or deformity   Heart:    Regular rate and rhythm, S1 and S2 normal, no murmur, rub   or gallop  Breast Exam:    Deferred to GYN  Abdomen:     Soft, non-tender, bowel sounds active all four quadrants,    no masses, no organomegaly  Genitalia:    Deferred to GYN  Rectal:    Extremities:   Extremities normal, atraumatic, no cyanosis or edema  Pulses:   2+ and symmetric all extremities  Skin:   Skin  color, texture, turgor normal, no rashes or lesions  Lymph nodes:   Cervical, supraclavicular, and axillary nodes normal  Neurologic:   CNII-XII intact, normal strength, sensation and reflexes    throughout          Assessment & Plan:

## 2015-01-25 ENCOUNTER — Encounter: Payer: Self-pay | Admitting: Family Medicine

## 2015-02-18 ENCOUNTER — Emergency Department (HOSPITAL_COMMUNITY)
Admission: EM | Admit: 2015-02-18 | Discharge: 2015-02-18 | Disposition: A | Discharge status: Home / Self Care | Payer: BC Managed Care – PPO | Attending: Emergency Medicine | Admitting: Emergency Medicine

## 2015-02-18 ENCOUNTER — Ambulatory Visit (INDEPENDENT_AMBULATORY_CARE_PROVIDER_SITE_OTHER): Payer: BC Managed Care – PPO | Admitting: Emergency Medicine

## 2015-02-18 ENCOUNTER — Encounter (HOSPITAL_COMMUNITY): Payer: Self-pay

## 2015-02-18 VITALS — BP 98/50 | HR 81 | Temp 99.3°F | Resp 14 | Ht 65.5 in | Wt 135.0 lb

## 2015-02-18 DIAGNOSIS — Z8719 Personal history of other diseases of the digestive system: Secondary | ICD-10-CM | POA: Insufficient documentation

## 2015-02-18 DIAGNOSIS — E860 Dehydration: Secondary | ICD-10-CM | POA: Insufficient documentation

## 2015-02-18 DIAGNOSIS — R509 Fever, unspecified: Secondary | ICD-10-CM | POA: Diagnosis not present

## 2015-02-18 DIAGNOSIS — R112 Nausea with vomiting, unspecified: Secondary | ICD-10-CM

## 2015-02-18 DIAGNOSIS — Z880 Allergy status to penicillin: Secondary | ICD-10-CM | POA: Insufficient documentation

## 2015-02-18 DIAGNOSIS — Z79899 Other long term (current) drug therapy: Secondary | ICD-10-CM | POA: Diagnosis not present

## 2015-02-18 DIAGNOSIS — R109 Unspecified abdominal pain: Secondary | ICD-10-CM | POA: Diagnosis not present

## 2015-02-18 DIAGNOSIS — G43909 Migraine, unspecified, not intractable, without status migrainosus: Secondary | ICD-10-CM | POA: Insufficient documentation

## 2015-02-18 LAB — POCT CBC
GRANULOCYTE PERCENT: 84.7 % — AB (ref 37–80)
HCT, POC: 39.9 % (ref 37.7–47.9)
HEMOGLOBIN: 13.9 g/dL (ref 12.2–16.2)
Lymph, poc: 0.2 — AB (ref 0.6–3.4)
MCH: 32.1 pg — AB (ref 27–31.2)
MCHC: 34.9 g/dL (ref 31.8–35.4)
MCV: 92.2 fL (ref 80–97)
MID (cbc): 0.8 (ref 0–0.9)
MPV: 8.2 fL (ref 0–99.8)
PLATELET COUNT, POC: 151 10*3/uL (ref 142–424)
POC Granulocyte: 5.7 (ref 2–6.9)
POC LYMPH PERCENT: 2.9 %L — AB (ref 10–50)
POC MID %: 12.4 %M — AB (ref 0–12)
RBC: 4.33 M/uL (ref 4.04–5.48)
RDW, POC: 12.9 %
WBC: 6.7 10*3/uL (ref 4.6–10.2)

## 2015-02-18 LAB — CBC WITH DIFFERENTIAL/PLATELET
BASOS ABS: 0 10*3/uL (ref 0.0–0.1)
BASOS PCT: 0 %
EOS PCT: 0 %
Eosinophils Absolute: 0 10*3/uL (ref 0.0–0.7)
HEMATOCRIT: 37.3 % (ref 36.0–46.0)
Hemoglobin: 12.5 g/dL (ref 12.0–15.0)
LYMPHS PCT: 4 %
Lymphs Abs: 0.2 10*3/uL — ABNORMAL LOW (ref 0.7–4.0)
MCH: 31.5 pg (ref 26.0–34.0)
MCHC: 33.5 g/dL (ref 30.0–36.0)
MCV: 94 fL (ref 78.0–100.0)
MONO ABS: 0.3 10*3/uL (ref 0.1–1.0)
MONOS PCT: 5 %
NEUTROS ABS: 5.2 10*3/uL (ref 1.7–7.7)
Neutrophils Relative %: 91 %
PLATELETS: 142 10*3/uL — AB (ref 150–400)
RBC: 3.97 MIL/uL (ref 3.87–5.11)
RDW: 12.8 % (ref 11.5–15.5)
WBC: 5.7 10*3/uL (ref 4.0–10.5)

## 2015-02-18 LAB — COMPREHENSIVE METABOLIC PANEL
ALT: 20 U/L (ref 14–54)
ANION GAP: 6 (ref 5–15)
AST: 29 U/L (ref 15–41)
Albumin: 3.5 g/dL (ref 3.5–5.0)
Alkaline Phosphatase: 39 U/L (ref 38–126)
BILIRUBIN TOTAL: 0.7 mg/dL (ref 0.3–1.2)
BUN: 23 mg/dL — ABNORMAL HIGH (ref 6–20)
CHLORIDE: 111 mmol/L (ref 101–111)
CO2: 24 mmol/L (ref 22–32)
Calcium: 7.6 mg/dL — ABNORMAL LOW (ref 8.9–10.3)
Creatinine, Ser: 0.6 mg/dL (ref 0.44–1.00)
GFR calc Af Amer: >60 mL/min (ref >60)
Glucose, Bld: 121 mg/dL — ABNORMAL HIGH (ref 65–99)
POTASSIUM: 3.8 mmol/L (ref 3.5–5.1)
Sodium: 141 mmol/L (ref 135–145)
TOTAL PROTEIN: 5.8 g/dL — AB (ref 6.5–8.1)

## 2015-02-18 LAB — LIPASE, BLOOD: LIPASE: 29 U/L (ref 11–51)

## 2015-02-18 MED ORDER — MORPHINE SULFATE (PF) 4 MG/ML IV SOLN
4.0000 mg | Freq: Once | INTRAVENOUS | Status: AC
  Administered 2015-02-18: 4 mg via INTRAVENOUS
  Filled 2015-02-18: qty 1

## 2015-02-18 MED ORDER — METOCLOPRAMIDE HCL 10 MG PO TABS: 10.0000 mg | ORAL_TABLET | Freq: Four times a day (QID) | ORAL | Status: DC | PRN

## 2015-02-18 MED ORDER — SODIUM CHLORIDE 0.9 % IV SOLN: 1000.0000 mL | INTRAVENOUS | Status: DC

## 2015-02-18 MED ORDER — SODIUM CHLORIDE 0.9 % IV SOLN
1000.0000 mL | Freq: Once | INTRAVENOUS | Status: AC
  Administered 2015-02-18: 1000 mL via INTRAVENOUS

## 2015-02-18 MED ORDER — METOCLOPRAMIDE HCL 5 MG/ML IJ SOLN
10.0000 mg | Freq: Once | INTRAMUSCULAR | Status: AC
  Administered 2015-02-18: 10 mg via INTRAVENOUS
  Filled 2015-02-18: qty 2

## 2015-02-18 MED ORDER — PROMETHAZINE HCL 25 MG/ML IJ SOLN
25.0000 mg | Freq: Once | INTRAMUSCULAR | Status: AC
  Administered 2015-02-18: 25 mg via INTRAMUSCULAR

## 2015-02-18 MED ORDER — HYOSCYAMINE SULFATE 0.125 MG SL SUBL: SUBLINGUAL_TABLET | SUBLINGUAL | Status: DC

## 2015-02-18 MED ORDER — PROMETHAZINE HCL 25 MG PO TABS: 25.0000 mg | ORAL_TABLET | Freq: Four times a day (QID) | ORAL | Status: DC | PRN

## 2015-02-18 NOTE — ED Notes (Signed)
Patient was alert, oriented and stable upon discharge. RN went over AVS and patient had no further questions.  

## 2015-02-18 NOTE — Patient Instructions (Signed)

## 2015-02-18 NOTE — ED Provider Notes (Signed)
CSN: [id number]     Arrival date & time 02/18/15  1245 History   First MD Initiated Contact with Patient 02/18/15 1333     Chief Complaint  Patient presents with  . Emesis  . Diarrhea     HPI Patient presents to the emergency department complaining of nausea and vomiting as well as several loose stools since last night.  Decreased oral intake.  She was seen in urgent care and given fluids and Phenergan and still was not feeling well and that she was sent to the emergency department for evaluation.  She denies fevers and chills.  No recent sick contacts.  Denies abdominal pain.  Reports crampy sensation in her upper abdomen.  No prior history of recurrent nausea vomiting.  No urinary symptoms.  No back pain or flank pain.  No chest pain or shortness of breath.    Past Medical History  Diagnosis Date  . Allergy   . Thyroid disease   . Migraine   . History of anal fissures   . Non-celiac gluten sensitivity   . Hypothyroid 07/2010   History reviewed. No pertinent past surgical history. Family History  Problem Relation Age of Onset  . Cancer Father   . Diabetes Maternal Grandfather   . Heart disease Paternal Grandfather    Social History  Substance Use Topics  . Smoking status: Never Smoker   . Smokeless tobacco: None  . Alcohol Use: No   OB History    No data available     Review of Systems All systems reviewed negative except as per history of present illness   Allergies  Penicillins and Zofran  Home Medications   Prior to Admission medications   Medication Sig Start Date End Date Taking? Authorizing Provider  Ascorbic Acid (VITAMIN C PO) Take 400 mg by mouth 2 (two) times daily.   Yes Historical Provider, MD  Cholecalciferol (VITAMIN D3) 5000 UNITS CAPS Take 2 capsules by mouth daily.   Yes Historical Provider, MD  Cimetidine (TAGAMET PO) Take 1 tablet by mouth daily as needed (acid reflux).    Yes Historical Provider, MD  clonazePAM (KLONOPIN) 0.5 MG tablet Take  0.25 mg by mouth 2 (two) times daily as needed for anxiety.   Yes Historical Provider, MD  cyclobenzaprine (FLEXERIL) 5 MG tablet Take 0.5-2 tablets (2.5-10 mg total) by mouth 3 (three) times daily as needed for muscle spasms. 07/18/14  Yes Tereasa Coop, PA-C  eletriptan (RELPAX) 40 MG tablet Take 40 mg by mouth as needed for migraine or headache. One tablet by mouth at onset of headache. May repeat in 2 hours if headache persists or recurs.   Yes Historical Provider, MD  ketoprofen (ORUDIS) 75 MG capsule Take 75 mg by mouth 4 (four) times daily as needed (migraines, if not relief then pt will take relpax).    Yes Historical Provider, MD  magnesium aspartate (MAGINEX) 615 MG tablet Take 615 mg by mouth at bedtime.    Yes Historical Provider, MD  Multiple Vitamin (MULTIVITAMIN) tablet Take 1 tablet by mouth daily.   Yes Historical Provider, MD  NON FORMULARY Nutrient 950 (multi), Mineral Rx, Adrenal Health, Vital Zymes Forte, liposomal Glutathione   Yes Historical Provider, MD  Omega-3 Fatty Acids (FISH OIL PO) Take 820 mg by mouth 2 (two) times daily.   Yes Historical Provider, MD  Progesterone Micronized (PROGESTERONE PO) Take 75 tablets by mouth at bedtime.    Yes Historical Provider, MD  hyoscyamine (LEVSIN/SL) 0.125 MG SL tablet  1 tablet every 6-8 hours sublingual as needed for abdominal cramping Patient not taking: Reported on 02/18/2015 02/18/15   Darlyne Russian, MD  metoCLOPramide (REGLAN) 10 MG tablet Take 1 tablet (10 mg total) by mouth every 6 (six) hours as needed for nausea or vomiting. 02/18/15   Jola Schmidt, MD  promethazine (PHENERGAN) 25 MG tablet Take 1 tablet (25 mg total) by mouth every 6 (six) hours as needed for nausea. Patient not taking: Reported on 02/18/2015 02/18/15   Darlyne Russian, MD   BP 102/54 mmHg  Pulse 74  Resp 14  SpO2 94% Physical Exam  Constitutional: She is oriented to person, place, and time. She appears well-developed and well-nourished. No distress.   HENT:  Head: Normocephalic and atraumatic.  Eyes: EOM are normal.  Neck: Normal range of motion.  Cardiovascular: Normal rate, regular rhythm and normal heart sounds.   Pulmonary/Chest: Effort normal and breath sounds normal.  Abdominal: Soft. She exhibits no distension. There is no tenderness.  Musculoskeletal: Normal range of motion.  Neurological: She is alert and oriented to person, place, and time.  Skin: Skin is warm and dry.  Psychiatric: She has a normal mood and affect. Judgment normal.  Nursing note and vitals reviewed.   ED Course  Procedures (including critical care time) Labs Review Labs Reviewed  CBC WITH DIFFERENTIAL/PLATELET - Abnormal; Notable for the following:    Platelets 142 (*)    Lymphs Abs 0.2 (*)    All other components within normal limits  COMPREHENSIVE METABOLIC PANEL - Abnormal; Notable for the following:    Glucose, Bld 121 (*)    BUN 23 (*)    Calcium 7.6 (*)    Total Protein 5.8 (*)    All other components within normal limits  LIPASE, BLOOD    Imaging Review No results found. I have personally reviewed and evaluated these images and lab results as part of my medical decision-making.   EKG Interpretation None      MDM   Final diagnoses:  Nausea and vomiting, vomiting of unspecified type  Dehydration   Pt feels much better at this time. Dc home in good condition. Tolerating fluids. Hydrated. Repeat abd exam is benign     Jola Schmidt, MD 02/18/15 1600

## 2015-02-18 NOTE — Addendum Note (Signed)
Addended by: [name] A on: 02/18/2015 11:34 AM   Modules accepted: Orders

## 2015-02-18 NOTE — Discharge Instructions (Signed)

## 2015-02-18 NOTE — ED Notes (Signed)
Per EMS, Pt from urgent care.  Pt went there this morning for n/v/d since last pm.  Pt was given 1 1/2 liters NS and phenergan at urgent care.  Labs drawn.  Pt continued to vomit.  Urgent care called for transport here.  Vitals: 104/58, hr 71, resp 16, 98% ra.  Pt did not vomit in route.  IV 20g LAC.  Finishing second NS liter now.

## 2015-02-18 NOTE — ED Notes (Signed)
Bed: WA01 Expected date:  Expected time:  Means of arrival:  Comments: NVD 

## 2015-02-18 NOTE — Progress Notes (Addendum)
This chart was scribed for [name], MD by Thea Alken, ED Scribe. This patient was seen in room 3 and the patient's care was started at 9:22 AM.  Chief Complaint:  Chief Complaint  Patient presents with  . Emesis    Started last night, after eating chilli for dinner  . Nausea  . Fever  . Diarrhea  . Generalized Body Aches  . Abdominal Pain    HPI: Laura Brock is a 54 y.o. female who reports to [facility] today complaining of emesis and diarrhea. [place] and [place]. She had bison chili for dinner last night but states her husband ate chili as well and is asymptomatic. Prior to bed she had a little heart burn, but woke up and hour later with nausea. She vomited 7 time throughout the night with associated body aches, sharp upper abdominal pain and fever of 100.8. She started having loose stools here, while waiting to be seen here. States she has not been able to keep fluids down. She denies sick contacts. No recent travel outside the country.  Pt states zofran gives her HA and does better with phenergan.   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   Past Medical History  Diagnosis Date  . Allergy   . Thyroid disease   . Migraine   . History of anal fissures   . Non-celiac gluten sensitivity   . Hypothyroid 07/2010   History reviewed. No pertinent past surgical history. Social History   Social History  . Marital Status: Married    Spouse Name: N/A  . Number of Children: N/A  . Years of Education: N/A   Social History Main Topics  . Smoking status: Never Smoker   . Smokeless tobacco: None  . Alcohol Use: No  . Drug Use: None  . Sexual Activity: Not Asked   Other Topics Concern  . None   Social History Narrative   Family History  Problem Relation Age of Onset  . Cancer Father   . Diabetes Maternal Grandfather   . Heart disease Paternal Grandfather    Allergies  Allergen Reactions  . Penicillins Swelling   Prior  to Admission medications   Medication Sig Start Date End Date Taking? Authorizing Provider  Ascorbic Acid (VITAMIN C PO) Take 400 mg by mouth 2 (two) times daily.   Yes Historical Provider, MD  ASCORBIC ACID BUFFERED PO Take by mouth as needed.   Yes Historical Provider, MD  Cholecalciferol (VITAMIN D3) 5000 UNITS CAPS Take 2 capsules by mouth daily.   Yes Historical Provider, MD  Cimetidine (TAGAMET PO) Take by mouth.   Yes Historical Provider, MD  clonazePAM (KLONOPIN) 0.5 MG tablet Take 0.25 mg by mouth 2 (two) times daily as needed for anxiety.   Yes Historical Provider, MD  cyclobenzaprine (FLEXERIL) 5 MG tablet Take 0.5-2 tablets (2.5-10 mg total) by mouth 3 (three) times daily as needed for muscle spasms. 07/18/14  Yes Tereasa Coop, PA-C  eletriptan (RELPAX) 40 MG tablet Take 40 mg by mouth as needed for migraine or headache. One tablet by mouth at onset of headache. May repeat in 2 hours if headache persists or recurs.   Yes Historical Provider, MD  ketoprofen (ORUDIS) 75 MG capsule Take 75 mg by mouth 4 (four) times daily as needed for pain.   Yes Historical Provider, MD  magnesium aspartate (MAGINEX) 615 MG tablet Take 615 mg by mouth at bedtime.  Yes Historical Provider, MD  Multiple Vitamin (MULTIVITAMIN) tablet Take 1 tablet by mouth daily.   Yes Historical Provider, MD  NON FORMULARY Nutrient 950 (multi), Mineral Rx, Adrenal Health, Vital Zymes Forte, liposomal Glutathione   Yes Historical Provider, MD  Omega-3 Fatty Acids (FISH OIL PO) Take 820 mg by mouth 2 (two) times daily.   Yes Historical Provider, MD  Progesterone Micronized (PROGESTERONE PO) Take by mouth.   Yes Historical Provider, MD  acetaminophen (TYLENOL) 500 MG tablet Take 2 tablets (1,000 mg total) by mouth every 8 (eight) hours as needed. Take 2 tabs every 8 hours. Patient not taking: Reported on 02/18/2015 07/18/14   Tereasa Coop, PA-C  NP THYROID 30 MG tablet TAKE 1/2 TABLET DAILY FOR 2 WEEKS THEN 1 TABLET 30  MINUTES BEFORE FOOD OR COFFEE EVERY MORNING 12/19/14   Historical Provider, MD  promethazine (PHENERGAN) 25 MG tablet Take 1 tablet (25 mg total) by mouth every 6 (six) hours as needed for nausea. Patient not taking: Reported on 02/18/2015 01/26/13   Theda Sers, PA-C  SELENIUM PO Take 200 mg by mouth.    Historical Provider, MD  Turmeric, Cur[place]uma [place], (CURCUMIN EXTRACT) POWD by Does not apply route.    Historical Provider, MD     ROS: The patient denies night sweats, unintentional weight loss, chest pain, palpitations, wheezing, dyspnea on exertion, dysuria, hematuria, melena, numbness, weakness, or tingling.   All other systems have been reviewed and were otherwise negative with the exception of those mentioned in the HPI and as above.    PHYSICAL EXAM: Filed Vitals:   02/18/15 0916  BP: 108/60  Pulse: 90  Temp: 99.6 F (37.6 C)  Resp: 14   Body mass index is 22.12 kg/(m^2).   General: Alert, no acute distress HEENT:  Normocephalic, atraumatic, oropharynx patent. Unremarkable  Eye: EOMI, [facility] Cardiovascular:  Regular rate and rhythm, no rubs murmurs or gallops.  No Carotid bruits, radial pulse intact. No pedal edema.  Respiratory: Clear to auscultation bilaterally.  No wheezes, rales, or rhonchi.  No cyanosis, no use of accessory musculature Abdominal: No organomegaly Bowel sounds present. abdomen flat. No definite areas of tenderness. Musculoskeletal: Gait intact. No edema, tenderness Skin: No rashes. Neurologic: Facial musculature symmetric. Psychiatric: Patient acts appropriately throughout our interaction. Lymphatic: No cervical or submandibular lymphadenopathy  LABS: Results for orders placed or performed in visit on 02/18/15  POCT CBC  Result Value Ref Range   WBC 6.7 4.6 - 10.2 K/uL   Lymph, poc 0.2 (A) 0.6 - 3.4   POC LYMPH PERCENT 2.9 (A) 10 - 50 %L   MID (cbc) 0.8 0 - 0.9   POC MID % 12.4 (A) 0 - 12 %M   POC Granulocyte 5.7 2 - 6.9   Granulocyte  percent 84.7 (A) 37 - 80 %G   RBC 4.33 4.04 - 5.48 M/uL   Hemoglobin 13.9 12.2 - 16.2 g/dL   HCT, POC 39.9 37.7 - 47.9 %   MCV 92.2 80 - 97 fL   MCH, POC 32.1 (A) 27 - 31.2 pg   MCHC 34.9 31.8 - 35.4 g/dL   RDW, POC 12.9 %   Platelet Count, POC 151 142 - 424 K/uL   MPV 8.2 0 - 99.8 fL     EKG/XRAY:   Primary read interpreted by Dr. Everlene Farrier at [facility].   ASSESSMENT/PLAN: Signs and symptoms most consistent with viral gastroenteritis. Blood count is normal. Will treat  with Phenergan and a clear liquid diet. She  was given a total of 1500 mL of IV fluids.I personally performed the services described in this documentation, which was scribed in my presence. The recorded information has been reviewed and is accurate she went to the bathroom to collect a specimen and her vomiting continues along with the diarrhea. Because of her persistent nausea vomiting and diarrhea IV will be reinserted and she will be transported to the emergency room for further management. In our office she is received a total of 1500 mL of fluids and 25 Phenergan IM. She cannot take Zofran.Johney Maine sideeffects, risk and benefits, and alternatives of medications d/w patient. Patient is aware that all medications have potential sideeffects and we are unable to predict every sideeffect or drug-drug interaction that may occur.  By signing my name below, I, Raven Small, attest that this documentation has been prepared under the direction and in the presence of [name], MD.  Electronically Signed: Thea Alken, ED Scribe. 02/18/2015. 9:32 AM.   [name] MD 02/18/2015 9:22 AM

## 2015-02-20 ENCOUNTER — Ambulatory Visit (INDEPENDENT_AMBULATORY_CARE_PROVIDER_SITE_OTHER): Payer: BC Managed Care – PPO | Admitting: Internal Medicine

## 2015-02-20 ENCOUNTER — Encounter: Payer: Self-pay | Admitting: Internal Medicine

## 2015-02-20 VITALS — BP 102/58 | HR 78 | Temp 98.3°F | Ht 65.0 in | Wt 140.2 lb

## 2015-02-20 DIAGNOSIS — A0811 Acute gastroenteropathy due to Norwalk agent: Secondary | ICD-10-CM

## 2015-02-20 LAB — GASTROINTESTINAL PATHOGEN PANEL PCR
C. difficile Tox A/B, PCR: NOT DETECTED
CRYPTOSPORIDIUM, PCR: NOT DETECTED
Campylobacter, PCR: NOT DETECTED
E COLI (ETEC) LT/ST, PCR: NOT DETECTED
E COLI 0157, PCR: NOT DETECTED
E coli (STEC) stx1/stx2, PCR: NOT DETECTED
GIARDIA LAMBLIA, PCR: NOT DETECTED
Norovirus, PCR: DETECTED
Rotavirus A, PCR: NOT DETECTED
SHIGELLA, PCR: NOT DETECTED
Salmonella, PCR: NOT DETECTED

## 2015-02-20 NOTE — Progress Notes (Signed)
Subjective:    Patient ID: Laura Brock, female    DOB: 11-13-1960, 54 y.o.   MRN: [id number]  DOS:  02/20/2015 Type of visit - description :  Acute visit, here with her hasn't Interval history: A couple of days ago went to the urgent care and subsequently to the ER with nausea, vomiting, diarrhea. She received IV fluids and was eventually sent home. Since then, GI symptoms have decreased, in fact no bowel movement since then but she has developed a dull, mild but persisting headache, different from her migraines. Headache is mostly on the forehead, not the worse of life. Wonders if he is okay to take Tylenol.  Labs reviewed: BMP, LFTs and CBC normal, stools showed + for norovirus.  Review of Systems She did have a low-grade temperature at the urgent care, then temperature was normal Good by mouth tolerance, abdominal cramps have decreased. Good urinary output.   Past Medical History  Diagnosis Date  . Allergy   . Thyroid disease   . Migraine   . History of anal fissures   . Non-celiac gluten sensitivity   . Hypothyroid 07/2010    History reviewed. No pertinent past surgical history.  Social History   Social History  . Marital Status: Married    Spouse Name: N/A  . Number of Children: N/A  . Years of Education: N/A   Occupational History  . Not on file.   Social History Main Topics  . Smoking status: Never Smoker   . Smokeless tobacco: Not on file  . Alcohol Use: No  . Drug Use: Not on file  . Sexual Activity: Not on file   Other Topics Concern  . Not on file   Social History Narrative        Medication List       This list is accurate as of: 02/20/15 11:59 PM.  Always use your most recent med list.               clonazePAM 0.5 MG tablet  Commonly known as:  KLONOPIN  Take 0.25 mg by mouth 2 (two) times daily as needed for anxiety.     cyclobenzaprine 5 MG tablet  Commonly known as:  FLEXERIL  Take 0.5-2 tablets (2.5-10 mg total) by mouth 3  (three) times daily as needed for muscle spasms.     eletriptan 40 MG tablet  Commonly known as:  RELPAX  Take 40 mg by mouth as needed for migraine or headache. One tablet by mouth at onset of headache. May repeat in 2 hours if headache persists or recurs.     FISH OIL PO  Take 820 mg by mouth 2 (two) times daily.     hyoscyamine 0.125 MG SL tablet  Commonly known as:  LEVSIN/SL  1 tablet every 6-8 hours sublingual as needed for abdominal cramping     ketoprofen 75 MG capsule  Commonly known as:  ORUDIS  Take 75 mg by mouth 4 (four) times daily as needed (migraines, if not relief then pt will take relpax).     magnesium aspartate 615 MG tablet  Commonly known as:  MAGINEX  Take 615 mg by mouth at bedtime.     metoCLOPramide 10 MG tablet  Commonly known as:  REGLAN  Take 1 tablet (10 mg total) by mouth every 6 (six) hours as needed for nausea or vomiting.     multivitamin tablet  Take 1 tablet by mouth daily.     NON FORMULARY  Nutrient 950 (multi), Mineral Rx, Adrenal Health, Vital Zymes Forte, liposomal Glutathione     PROGESTERONE PO  Take 75 tablets by mouth at bedtime.     promethazine 25 MG tablet  Commonly known as:  PHENERGAN  Take 1 tablet (25 mg total) by mouth every 6 (six) hours as needed for nausea.     TAGAMET PO  Take 1 tablet by mouth daily as needed (acid reflux).     VITAMIN C PO  Take 400 mg by mouth 2 (two) times daily.     Vitamin D3 5000 UNITS Caps  Take 2 capsules by mouth daily.           Objective:   Physical Exam BP 102/58 mmHg  Pulse 78  Temp(Src) 98.3 F (36.8 C) (Oral)  Ht 5\' 5"  (1.651 m)  Wt 140 lb 4 oz (63.617 kg)  BMI 23.34 kg/m2  SpO2 99% General:   Well developed, well nourished . Looks tired but not in distress HEENT:  Normocephalic . Face symmetric, atraumatic. Oral membranes moist Neck: Supple Lungs:  CTA B Normal respiratory effort, no intercostal retractions, no accessory muscle use. Heart: RRR,  no murmur.    no pretibial edema bilaterally  Abdomen:  Not distended, soft, non-tender. No rebound or rigidity.  Skin: Not pale. Not jaundice Neurologic:  alert & oriented X3.  Speech normal, gait appropriate for age and unassisted Psych--  Cognition and judgment appear intact.  Cooperative with normal attention span and concentration.  Behavior appropriate. No anxious or depressed appearing.    Assessment & Plan:   Acute gastroenteritis, stool testing + for noravirus. Symptoms consistent with noravirus, she is actually getting better, she does have a persisting headache, neck is supple. She does not look toxic. Headache likely due to the virus, recommend Tylenol.  Conservative treatment, see instructions.

## 2015-02-20 NOTE — Patient Instructions (Signed)
Rest  drink lots of clear fluids Nausea medicine as needed [place] tylenol Call or go to the  ER if severe symptoms, severe headache, fever, chills or if not gradually improving in the next 3 days     Norovirus Infection A norovirus infection is caused by exposure to a virus in a group of similar viruses (noroviruses). This type of infection causes inflammation in your stomach and intestines (gastroenteritis). Norovirus is the most common cause of gastroenteritis. It also causes food poisoning. Anyone can get a norovirus infection. It spreads very easily (contagious). You can get it from contaminated food, water, surfaces, or other people. Norovirus is found in the stool or vomit of infected people. You can spread the infection as soon as you feel sick until 2 weeks after you recover.  Symptoms usually begin within 2 days after you become infected. Most norovirus symptoms affect the digestive system. CAUSES Norovirus infection is caused by contact with norovirus. You can catch norovirus if you:  Eat or drink something contaminated with norovirus.  Touch surfaces or objects contaminated with norovirus and then put your hand in your mouth.  Have direct contact with an infected person who has symptoms.  Share food, drink, or utensils with someone with who is sick with norovirus. SIGNS AND SYMPTOMS Symptoms of norovirus may include:  Nausea.  Vomiting.  Diarrhea.  Stomach cramps.  Fever.  Chills.  Headache.  Muscle aches.  Tiredness. DIAGNOSIS Your health care provider may suspect norovirus based on your symptoms and physical exam. Your health care provider may also test a sample of your stool or vomit for the virus.  TREATMENT There is no specific treatment for norovirus. Most people get better without treatment in about 2 days. HOME CARE INSTRUCTIONS  Replace lost fluids by drinking plenty of water or rehydration fluids containing important minerals called  electrolytes. This prevents dehydration. Drink enough fluid to keep your urine clear or pale yellow.  Do not prepare food for others while you are infected. Wait at least 3 days after recovering from the illness to do that. PREVENTION   Wash your hands often, especially after using the toilet or changing a diaper.  Wash fruits and vegetables thoroughly before preparing or serving them.  Throw out any food that a sick person may have touched.  Disinfect contaminated surfaces immediately after someone in the household has been sick. Use a bleach-based household cleaner.  Immediately remove and wash soiled clothes or sheets. SEEK MEDICAL CARE IF:  Your vomiting, diarrhea, and stomach pain is getting worse.  Your symptoms of norovirus do not go away after 2-3 days. SEEK IMMEDIATE MEDICAL CARE IF:  You develop symptoms of dehydration that do not improve with fluid replacement. This may include:  Excessive sleepiness.  Lack of tears.  Dry mouth.  Dizziness when standing.  Weak pulse.   This information is not intended to replace advice given to you by your health care provider. Make sure you discuss any questions you have with your health care provider.   Document Released: 06/06/2002 Document Revised: 04/06/2014 Document Reviewed: 08/24/2013 Elsevier Interactive Patient Education [employer].

## 2015-02-20 NOTE — Progress Notes (Signed)
Pre visit review using our clinic review tool, if applicable. No additional management support is needed unless otherwise documented below in the visit note. 

## 2015-02-22 ENCOUNTER — Encounter: Payer: Self-pay | Admitting: Family Medicine

## 2015-02-22 ENCOUNTER — Ambulatory Visit (INDEPENDENT_AMBULATORY_CARE_PROVIDER_SITE_OTHER): Payer: BC Managed Care – PPO | Admitting: Family Medicine

## 2015-02-22 VITALS — BP 102/66 | HR 68 | Temp 98.3°F | Resp 16 | Ht 65.0 in | Wt 135.4 lb

## 2015-02-22 DIAGNOSIS — M62838 Other muscle spasm: Secondary | ICD-10-CM

## 2015-02-22 DIAGNOSIS — A0811 Acute gastroenteropathy due to Norwalk agent: Secondary | ICD-10-CM | POA: Diagnosis not present

## 2015-02-22 DIAGNOSIS — M6248 Contracture of muscle, other site: Secondary | ICD-10-CM

## 2015-02-22 DIAGNOSIS — G43811 Other migraine, intractable, with status migrainosus: Secondary | ICD-10-CM

## 2015-02-22 MED ORDER — CYCLOBENZAPRINE HCL 5 MG PO TABS: 2.5000 mg | ORAL_TABLET | Freq: Three times a day (TID) | ORAL | Status: DC | PRN

## 2015-02-22 MED ORDER — METHYLPREDNISOLONE SODIUM SUCC 125 MG IJ SOLR
125.0000 mg | Freq: Once | INTRAMUSCULAR | Status: AC
  Administered 2015-02-22: 125 mg via INTRAMUSCULAR

## 2015-02-22 MED ORDER — KETOROLAC TROMETHAMINE 60 MG/2ML IM SOLN
60.0000 mg | Freq: Once | INTRAMUSCULAR | Status: AC
  Administered 2015-02-22: 60 mg via INTRAMUSCULAR

## 2015-02-22 MED ORDER — METOCLOPRAMIDE HCL 10 MG PO TABS: 10.0000 mg | ORAL_TABLET | Freq: Four times a day (QID) | ORAL | Status: DC | PRN

## 2015-02-22 MED ORDER — PROMETHAZINE HCL 25 MG/ML IJ SOLN
25.0000 mg | Freq: Once | INTRAMUSCULAR | Status: AC
  Administered 2015-02-22: 25 mg via INTRAMUSCULAR

## 2015-02-22 NOTE — Progress Notes (Signed)
Subjective:    Patient ID: Laura Brock, female    DOB: 1961-01-24, 54 y.o.   MRN: [id number] Chief Complaint  Patient presents with  . Migraine    2 days  . Nausea  . Medication Refill    REGLAN and FLEXERIL    HPI 5d ago contracted norovirus - was seen in the ER and given IVF for dehydration - mild HA at that time. She felt really bloated/swollen after with facial and leg swelling. Used all anti-emetics. Since then HA is progressively worsened - hurts more when she lays down.  Was able to tolerate a small amount of food last night - a little chicken - for the first time in sev d.  Has been taking tylenol for her fever but now switched to nsaid and several relax as her migraine worsened.  Has been to weak to get out of bed for sev d.   does not eat dairy  Has seen [facility] and Dr. Sharol Roussel in past and tried many alternative forms of thyroid supp but none really helped. Can't tolerate any fillers in the levothyrosine. Was doing ok on [employer] until the co that manufactured it was sold and the formulary changed and now can't tolerate that either.   Past Medical History  Diagnosis Date  . Allergy   . Thyroid disease   . Migraine   . History of anal fissures   . Non-celiac gluten sensitivity   . Hypothyroid 07/2010   Current Outpatient Prescriptions on File Prior to Visit  Medication Sig Dispense Refill  . Ascorbic Acid (VITAMIN C PO) Take 400 mg by mouth 2 (two) times daily.    . Cholecalciferol (VITAMIN D3) 5000 UNITS CAPS Take 2 capsules by mouth daily.    . Cimetidine (TAGAMET PO) Take 1 tablet by mouth daily as needed (acid reflux).     . clonazePAM (KLONOPIN) 0.5 MG tablet Take 0.25 mg by mouth 2 (two) times daily as needed for anxiety.    Marland Kitchen eletriptan (RELPAX) 40 MG tablet Take 40 mg by mouth as needed for migraine or headache. One tablet by mouth at onset of headache. May repeat in 2 hours if headache persists or recurs.    Marland Kitchen ketoprofen (ORUDIS) 75 MG capsule Take 75 mg  by mouth 4 (four) times daily as needed (migraines, if not relief then pt will take relpax).     . magnesium aspartate (MAGINEX) 615 MG tablet Take 615 mg by mouth at bedtime.     . Multiple Vitamin (MULTIVITAMIN) tablet Take 1 tablet by mouth daily.    . NON FORMULARY Nutrient 950 (multi), Mineral Rx, Adrenal Health, Vital Zymes Forte, liposomal Glutathione    . Omega-3 Fatty Acids (FISH OIL PO) Take 820 mg by mouth 2 (two) times daily.    . Progesterone Micronized (PROGESTERONE PO) Take 75 tablets by mouth at bedtime.     . promethazine (PHENERGAN) 25 MG tablet Take 1 tablet (25 mg total) by mouth every 6 (six) hours as needed for nausea. 12 tablet 0  . hyoscyamine (LEVSIN/SL) 0.125 MG SL tablet 1 tablet every 6-8 hours sublingual as needed for abdominal cramping (Patient not taking: Reported on 02/18/2015) 10 tablet 0   No current facility-administered medications on file prior to visit.   Allergies  Allergen Reactions  . Penicillins Swelling    Has patient had a PCN reaction causing immediate rash, facial/tongue/throat swelling, SOB or lightheadedness with hypotension: Yes Has patient had a PCN reaction causing severe rash  involving mucus membranes or skin necrosis: No Has patient had a PCN reaction that required hospitalization Yes- pcp visit Has patient had a PCN reaction occurring within the last 10 years: no, childhood allergy If all of the above answers are "NO", then may proceed with Cephalosporin use.   Marland Kitchen Zofran [Ondansetron Hcl]     Patient has severe headaches with Zofran.     Review of Systems  Constitutional: Positive for activity change, appetite change and fatigue. Negative for fever, chills, diaphoresis and unexpected weight change.  HENT: Positive for facial swelling. Negative for congestion, dental problem, ear pain, rhinorrhea, sinus pressure, tinnitus and trouble swallowing.   Eyes: Positive for photophobia. Negative for pain, discharge and visual disturbance.    Cardiovascular: Positive for leg swelling.  Gastrointestinal: Positive for nausea. Negative for vomiting, abdominal pain, diarrhea, constipation and abdominal distention.  Musculoskeletal: Negative for neck pain and neck stiffness.  Skin: Negative for rash.  Allergic/Immunologic: Negative for immunocompromised state.  Neurological: Positive for weakness and headaches. Negative for dizziness, tremors, seizures, syncope, facial asymmetry, speech difficulty, light-headedness and numbness.  Hematological: Positive for adenopathy.  Psychiatric/Behavioral: Positive for sleep disturbance.       Objective:  BP 102/66 mmHg  Pulse 68  Temp(Src) 98.3 F (36.8 C) (Oral)  Resp 16  Ht 5\' 5"  (1.651 m)  Wt 135 lb 6.4 oz (61.417 kg)  BMI 22.53 kg/m2  Physical Exam  Constitutional: She is oriented to person, place, and time. Vital signs are normal. She appears well-developed and well-nourished. She appears lethargic. She appears distressed.  Laying on exam table in dark room, all lights off, sunglasses on.  HENT:  Head: Normocephalic and atraumatic.  Right Ear: Tympanic membrane, external ear and ear canal normal.  Left Ear: Tympanic membrane, external ear and ear canal normal.  Nose: Nose normal. Right sinus exhibits no maxillary sinus tenderness. Left sinus exhibits no maxillary sinus tenderness.  Mouth/Throat: Uvula is midline, oropharynx is clear and moist and mucous membranes are normal. No oropharyngeal exudate.  Eyes: Conjunctivae and EOM are normal. Pupils are equal, round, and reactive to light.  Neck: Normal range of motion. Neck supple. No thyromegaly present.  Cardiovascular: Normal rate, regular rhythm, normal heart sounds and intact distal pulses.   Pulmonary/Chest: Effort normal and breath sounds normal. No respiratory distress.  Abdominal: Soft. Normal appearance and bowel sounds are normal. She exhibits no distension and no mass. There is no hepatosplenomegaly. There is no  tenderness. There is no rebound, no guarding and no CVA tenderness.  Neurological: She is oriented to person, place, and time. She has normal strength. She appears lethargic. No cranial nerve deficit or sensory deficit. Coordination and gait normal.  Skin: Skin is warm and dry. She is not diaphoretic.  Psychiatric: She has a normal mood and affect. Her behavior is normal.          Assessment & Plan:   1. Other migraine with status migrainosus, intractable - IM promethazint 25, solmedrol 125, and toradol 60 in office which pt has responded well to sev times prior.  Ok to take relpax tonight in 6 hours and flexeril 1-2 hrs later if HA is persisting.  2. Neck muscle spasm   3. Gastroenteritis due to norovirus - resolved but likely still dehydrated, increase protein in diet, push fluids. Avoid antihistamines currently due to dehydration  4.     Hypothyroid - intolerant of all thyroid replacement due to fillers.   Meds ordered this encounter  Medications  . metoCLOPramide (  REGLAN) 10 MG tablet    Sig: Take 1 tablet (10 mg total) by mouth every 6 (six) hours as needed for nausea or vomiting.    Dispense:  30 tablet    Refill:  1  . cyclobenzaprine (FLEXERIL) 5 MG tablet    Sig: Take 0.5-2 tablets (2.5-10 mg total) by mouth 3 (three) times daily as needed for muscle spasms.    Dispense:  40 tablet    Refill:  1  . ketorolac (TORADOL) injection 60 mg    Sig:   . methylPREDNISolone sodium succinate (SOLU-MEDROL) 125 mg/2 mL injection 125 mg    Sig:   . promethazine (PHENERGAN) injection 25 mg    Sig:    Delman Cheadle, MD MPH

## 2015-05-26 DIAGNOSIS — Z8371 Family history of colonic polyps: Secondary | ICD-10-CM | POA: Insufficient documentation

## 2015-08-01 ENCOUNTER — Encounter: Payer: Self-pay | Admitting: Family Medicine

## 2015-08-01 ENCOUNTER — Ambulatory Visit (INDEPENDENT_AMBULATORY_CARE_PROVIDER_SITE_OTHER): Payer: BC Managed Care – PPO | Admitting: Family Medicine

## 2015-08-01 VITALS — BP 106/70 | HR 70 | Temp 98.1°F | Resp 16 | Ht 65.0 in | Wt 136.5 lb

## 2015-08-01 DIAGNOSIS — R109 Unspecified abdominal pain: Secondary | ICD-10-CM | POA: Insufficient documentation

## 2015-08-01 DIAGNOSIS — R1012 Left upper quadrant pain: Secondary | ICD-10-CM

## 2015-08-01 NOTE — Assessment & Plan Note (Signed)
New.  Pt's fasciculations have resolved.  Discomfort is relieved w/ pressure on the abdominal wall.  Given her report of straining her back around the same time as onset of abdominal sxs, I suspect this is musculoskeletal in nature.  Encouraged NSAID use as both a diagnostic and therapeutic tool.  No red flags on hx or PE.  Reviewed supportive care and red flags that should prompt return.  Pt expressed understanding and is in agreement w/ plan.

## 2015-08-01 NOTE — Progress Notes (Signed)
Pre visit review using our clinic review tool, if applicable. No additional management support is needed unless otherwise documented below in the visit note. 

## 2015-08-01 NOTE — Patient Instructions (Signed)
Your abdominal symptoms appear to be muscular in nature and should improve w/ time and anti-inflammatories Make sure you are bending and lifting with your knees and not your back or abs If your symptoms don't improve- let me know so we can proceed with additional workup Call with any questions or concerns Please consider seeing an endocrinologist for your thyroid issues Have a great trip!!

## 2015-08-01 NOTE — Progress Notes (Signed)
   Subjective:    Patient ID: Laura Brock, female    DOB: Apr 01, 1960, 55 y.o.   MRN: [id number]  HPI abd issue- pt reports ~3 weeks ago developed muscle twitching or fasciculation of L upper abdominal wall.  Pt now reports a discomfort that improves w/ direct pressure.  No changes to bowel or bladder habits.  Pt had colonoscopy a few weeks ago.  No nausea or vomiting.  Did pull a muscle in her back around the same time.   Review of Systems For ROS see HPI     Objective:   Physical Exam  Constitutional: She is oriented to person, place, and time. She appears well-developed and well-nourished. No distress.  HENT:  Head: Normocephalic and atraumatic.  Abdominal: Soft. Bowel sounds are normal. She exhibits no distension and no mass (no palpable hernia or abdominal wall defect). There is no tenderness. There is no rebound and no guarding.  Neurological: She is alert and oriented to person, place, and time.  Skin: Skin is warm and dry. No rash noted. No erythema.  Psychiatric: She has a normal mood and affect. Her behavior is normal. Thought content normal.  Vitals reviewed.         Assessment & Plan:

## 2015-11-07 LAB — HM MAMMOGRAPHY: HM Mammogram: NORMAL (ref 0–4)

## 2015-11-07 LAB — HM DEXA SCAN

## 2015-11-07 LAB — HM PAP SMEAR

## 2016-01-07 ENCOUNTER — Ambulatory Visit (INDEPENDENT_AMBULATORY_CARE_PROVIDER_SITE_OTHER): Payer: BC Managed Care – PPO | Admitting: Family Medicine

## 2016-01-07 ENCOUNTER — Encounter: Payer: Self-pay | Admitting: Family Medicine

## 2016-01-07 VITALS — BP 110/70 | HR 75 | Temp 98.6°F | Resp 16 | Ht 65.0 in | Wt 135.4 lb

## 2016-01-07 DIAGNOSIS — Z0000 Encounter for general adult medical examination without abnormal findings: Secondary | ICD-10-CM

## 2016-01-07 DIAGNOSIS — Z23 Encounter for immunization: Secondary | ICD-10-CM | POA: Diagnosis not present

## 2016-01-07 LAB — BASIC METABOLIC PANEL
BUN: 18 mg/dL (ref 6–23)
CHLORIDE: 106 meq/L (ref 96–112)
CO2: 30 meq/L (ref 19–32)
CREATININE: 0.88 mg/dL (ref 0.40–1.20)
Calcium: 9.5 mg/dL (ref 8.4–10.5)
GFR: 70.56 mL/min (ref >60.00)
GLUCOSE: 63 mg/dL — AB (ref 70–99)
Potassium: 4.1 mEq/L (ref 3.5–5.1)
Sodium: 143 mEq/L (ref 135–145)

## 2016-01-07 LAB — VITAMIN D 25 HYDROXY (VIT D DEFICIENCY, FRACTURES): VITD: 79.46 ng/mL (ref 30.00–100.00)

## 2016-01-07 LAB — CBC WITH DIFFERENTIAL/PLATELET
Basophils Absolute: 0 10*3/uL (ref 0.0–0.1)
Basophils Relative: 0.8 % (ref 0.0–3.0)
EOS ABS: 0.1 10*3/uL (ref 0.0–0.7)
Eosinophils Relative: 1.8 % (ref 0.0–5.0)
HCT: 39.2 % (ref 36.0–46.0)
HEMOGLOBIN: 13.3 g/dL (ref 12.0–15.0)
LYMPHS ABS: 1.3 10*3/uL (ref 0.7–4.0)
Lymphocytes Relative: 29.3 % (ref 12.0–46.0)
MCHC: 33.9 g/dL (ref 30.0–36.0)
MCV: 93.1 fl (ref 78.0–100.0)
MONO ABS: 0.5 10*3/uL (ref 0.1–1.0)
Monocytes Relative: 10.7 % (ref 3.0–12.0)
NEUTROS PCT: 57.4 % (ref 43.0–77.0)
Neutro Abs: 2.5 10*3/uL (ref 1.4–7.7)
Platelets: 161 10*3/uL (ref 150.0–400.0)
RBC: 4.21 Mil/uL (ref 3.87–5.11)
RDW: 13 % (ref 11.5–15.5)
WBC: 4.3 10*3/uL (ref 4.0–10.5)

## 2016-01-07 LAB — HEPATIC FUNCTION PANEL
ALBUMIN: 4.3 g/dL (ref 3.5–5.2)
ALK PHOS: 62 U/L (ref 39–117)
ALT: 17 U/L (ref 0–35)
AST: 19 U/L (ref 0–37)
Bilirubin, Direct: 0.1 mg/dL (ref 0.0–0.3)
TOTAL PROTEIN: 7 g/dL (ref 6.0–8.3)
Total Bilirubin: 0.4 mg/dL (ref 0.2–1.2)

## 2016-01-07 LAB — LIPID PANEL
CHOL/HDL RATIO: 3
CHOLESTEROL: 174 mg/dL (ref 0–200)
HDL: 55.9 mg/dL (ref >39.00)
LDL Cholesterol: 106 mg/dL — ABNORMAL HIGH (ref 0–99)
NonHDL: 118.37
TRIGLYCERIDES: 63 mg/dL (ref 0.0–149.0)
VLDL: 12.6 mg/dL (ref 0.0–40.0)

## 2016-01-07 LAB — TSH: TSH: 0.99 u[IU]/mL (ref 0.35–4.50)

## 2016-01-07 NOTE — Progress Notes (Signed)
   Subjective:    Patient ID: Laura Brock, female    DOB: [birth year], 55 y.o.   MRN: [id number]  HPI CPE- UTD on pap, mammo, DEXA w/ GYN (Dr Runell Gess).  UTD on colonoscopy (due 2022)  Pt is also seeing [facility]ist ([facility])   Review of Systems Patient reports no vision/ hearing changes, adenopathy,fever, weight change,  persistant/recurrent hoarseness , swallowing issues, chest pain, palpitations, edema, persistant/recurrent cough, hemoptysis, dyspnea (rest/exertional/paroxysmal nocturnal), gastrointestinal bleeding (melena, rectal bleeding) significant heartburn, bowel changes, GU symptoms (dysuria, hematuria, incontinence), Gyn symptoms (abnormal  bleeding, pain),  syncope, focal weakness, memory loss, numbness & tingling, skin/hair/nail changes, abnormal bruising or bleeding, anxiety, or depression.   + epigastric pain after drinking caffeine and alcohol this weekend    Objective:   Physical Exam General Appearance:    Alert, cooperative, no distress, appears stated age  Head:    Normocephalic, without obvious abnormality, atraumatic  Eyes:    PERRL, conjunctiva/corneas clear, EOM's intact, fundi    benign, both eyes  Ears:    Normal TM's and external ear canals, both ears  Nose:   Nares normal, septum midline, mucosa normal, no drainage    or sinus tenderness  Throat:   Lips, mucosa, and tongue normal; teeth and gums normal  Neck:   Supple, symmetrical, trachea midline, no adenopathy;    Thyroid: no enlargement/tenderness/nodules  Back:     Symmetric, no curvature, ROM normal, no CVA tenderness  Lungs:     Clear to auscultation bilaterally, respirations unlabored  Chest Wall:    No tenderness or deformity   Heart:    Regular rate and rhythm, S1 and S2 normal, no murmur, rub   or gallop  Breast Exam:    Deferred to GYN  Abdomen:     Soft, non-tender, bowel sounds active all four quadrants,    no masses, no organomegaly  Genitalia:    Deferred  to GYN  Rectal:    Extremities:   Extremities normal, atraumatic, no cyanosis or edema  Pulses:   2+ and symmetric all extremities  Skin:   Skin color, texture, turgor normal, no rashes or lesions  Lymph nodes:   Cervical, supraclavicular, and axillary nodes normal  Neurologic:   CNII-XII intact, normal strength, sensation and reflexes    throughout          Assessment & Plan:

## 2016-01-07 NOTE — Progress Notes (Signed)
Pre visit review using our clinic review tool, if applicable. No additional management support is needed unless otherwise documented below in the visit note. 

## 2016-01-07 NOTE — Patient Instructions (Signed)
Follow up in 1 year or as needed We'll notify you of your lab results and make any changes if needed Continue to work on healthy diet and regular exercise Use your acid reducer as needed for abdominal pain- add Tums as needed Add daily Calcium of ~1200 units for the osteopenia Call with any questions or concerns Hang in there!!!

## 2016-01-07 NOTE — Assessment & Plan Note (Signed)
Pt's PE WNL.  UTD on GYN, colonoscopy.  Check labs.  Tdap updated.  Anticipatory guidance provided.

## 2016-01-13 ENCOUNTER — Telehealth: Payer: Self-pay | Admitting: Family Medicine

## 2016-01-13 ENCOUNTER — Encounter: Payer: Self-pay | Admitting: General Practice

## 2016-01-13 NOTE — Telephone Encounter (Signed)
Pt asking if KT would call in Carafate, pt states that she is still having issues with her stomach, [facility] on [address].

## 2016-01-13 NOTE — Telephone Encounter (Signed)
Pt informed of PCP recommendations in mychart.

## 2016-01-13 NOTE — Telephone Encounter (Signed)
No record of prescribing carafate for pt.  If she is having abdominal issues that require more than OTC medication, I would recommend an appt.  She should start w/ an OTC acid reducer (omeprazole, nexium, etc) and monitor for improvement.  If none, will need appt

## 2016-01-13 NOTE — Telephone Encounter (Signed)
Please advise pt was seen on 01/07/16 (CPE). No mention on stomach pain last treated for this in May. I also do not see carafate on her current or past med list

## 2016-02-10 ENCOUNTER — Ambulatory Visit (INDEPENDENT_AMBULATORY_CARE_PROVIDER_SITE_OTHER): Payer: BC Managed Care – PPO | Admitting: Family Medicine

## 2016-02-10 ENCOUNTER — Encounter: Payer: Self-pay | Admitting: Family Medicine

## 2016-02-10 VITALS — BP 112/78 | HR 71 | Temp 98.0°F | Resp 16 | Ht 65.0 in | Wt 136.1 lb

## 2016-02-10 DIAGNOSIS — F411 Generalized anxiety disorder: Secondary | ICD-10-CM | POA: Diagnosis not present

## 2016-02-10 DIAGNOSIS — R490 Dysphonia: Secondary | ICD-10-CM

## 2016-02-10 MED ORDER — CLONAZEPAM 0.5 MG PO TABS: 0.2500 mg | ORAL_TABLET | Freq: Two times a day (BID) | ORAL | 1 refills | Status: DC | PRN

## 2016-02-10 MED ORDER — OMEPRAZOLE 40 MG PO CPDR: 40.0000 mg | DELAYED_RELEASE_CAPSULE | Freq: Every day | ORAL | 3 refills | Status: DC

## 2016-02-10 NOTE — Progress Notes (Signed)
   Subjective:    Patient ID: Laura Brock, female    DOB: 04/07/1960, 55 y.o.   MRN: [id number]  HPI Hoarseness- sxs started ~4 months ago after going on a trip to [place].  Pt and husband are very concerned about lung cancer due to close family member being dx'd w/ lung cancer.  sxs are better in the morning but worsen throughout the day.  No weight loss, night sweats, fevers, productive cough.  sxs improve w/ lozenges but return.  Pt states this is not consistent w/ GERD.  Pt is not a smoker.  Pt asked for carafate last month due to epigastric pain.    Fatigue- husband reports she is 'chronically fatigued' and 'always weak and tired'.  She has recently stopped walking.  Had recent thyroid testing that showed normal levels.  Seeing Integrative doctor who is 'worried about her adrenals'.   Pt reports associated 'air hunger' where she feels she 'can't get a deep breath'.  Air hunger improves w/ Klonopin.  Review of Systems For ROS see HPI     Objective:   Physical Exam  Constitutional: She is oriented to person, place, and time. She appears well-developed and well-nourished. No distress.  HENT:  Head: Normocephalic and atraumatic.  Eyes: Conjunctivae and EOM are normal. Pupils are equal, round, and reactive to light.  Neck: Normal range of motion. Neck supple. No thyromegaly present.  Cardiovascular: Normal rate, regular rhythm, normal heart sounds and intact distal pulses.   No murmur heard. Pulmonary/Chest: Effort normal and breath sounds normal. No respiratory distress.  Abdominal: Soft. She exhibits no distension. There is no tenderness.  Musculoskeletal: She exhibits no edema.  Lymphadenopathy:    She has no cervical adenopathy.  Neurological: She is alert and oriented to person, place, and time.  Skin: Skin is warm and dry.  Psychiatric: Her behavior is normal.  Anxious, tearful when talking about the death of her cousin  Vitals reviewed.         Assessment & Plan:    Hoarseness- ongoing issue for pt x4 months.  Not currently on prescription strength PPI- will start PPI today as I suspect this is due to under treated reflux.  Refer to ENT for complete evaluation of hoarseness.  Reassured them that lung cancer is very low on my differential- they felt better about this.  Fatigue- given her lack of energy, low motivation, withdrawing from things she enjoys, I suspect her fatigue is due to anxiety/depression from grief.  Her air hunger improves w/ Klonopin- this along w/ her recent normal labs make metabolic, cardiac, or pulmonary issue much less likely.  Will hold on SSRI at this time but will continue to follow.  Pt expressed understanding and is in agreement w/ plan.   Total time spent w/ pt, 35 minutes, >50% spent counseling

## 2016-02-10 NOTE — Patient Instructions (Signed)
Follow up as needed/scheduled We'll call you with your ENT appt to investigate the hoarseness Start the Omeprazole daily to decrease the acid production Continue to drink plenty of fluids Try and work on stress management.  Grief is a process and will take time but there are ways we can help if needed! Call with any questions or concerns Hang in there!  We'll get to the bottom of this! Happy Holidays!!!

## 2016-02-10 NOTE — Progress Notes (Signed)
Pre visit review using our clinic review tool, if applicable. No additional management support is needed unless otherwise documented below in the visit note. 
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
## 2016-05-28 ENCOUNTER — Emergency Department (HOSPITAL_COMMUNITY)
Admission: EM | Admit: 2016-05-28 | Discharge: 2016-05-28 | Disposition: A | Discharge status: Home / Self Care | Payer: BC Managed Care – PPO | Attending: Emergency Medicine | Admitting: Emergency Medicine

## 2016-05-28 ENCOUNTER — Emergency Department (HOSPITAL_COMMUNITY): Payer: BC Managed Care – PPO

## 2016-05-28 ENCOUNTER — Encounter (HOSPITAL_COMMUNITY): Payer: Self-pay

## 2016-05-28 DIAGNOSIS — Z79899 Other long term (current) drug therapy: Secondary | ICD-10-CM | POA: Diagnosis not present

## 2016-05-28 DIAGNOSIS — E039 Hypothyroidism, unspecified: Secondary | ICD-10-CM | POA: Insufficient documentation

## 2016-05-28 DIAGNOSIS — G43501 Persistent migraine aura without cerebral infarction, not intractable, with status migrainosus: Secondary | ICD-10-CM | POA: Insufficient documentation

## 2016-05-28 DIAGNOSIS — R51 Headache: Secondary | ICD-10-CM | POA: Diagnosis present

## 2016-05-28 LAB — BASIC METABOLIC PANEL
ANION GAP: 4 — AB (ref 5–15)
BUN: 17 mg/dL (ref 6–20)
CHLORIDE: 109 mmol/L (ref 101–111)
CO2: 27 mmol/L (ref 22–32)
Calcium: 9.2 mg/dL (ref 8.9–10.3)
Creatinine, Ser: 0.76 mg/dL (ref 0.44–1.00)
GFR calc non Af Amer: >60 mL/min (ref >60)
GLUCOSE: 87 mg/dL (ref 65–99)
Potassium: 3.5 mmol/L (ref 3.5–5.1)
Sodium: 140 mmol/L (ref 135–145)

## 2016-05-28 LAB — CBC
HEMATOCRIT: 37 % (ref 36.0–46.0)
HEMOGLOBIN: 12.6 g/dL (ref 12.0–15.0)
MCH: 30.7 pg (ref 26.0–34.0)
MCHC: 34.1 g/dL (ref 30.0–36.0)
MCV: 90.2 fL (ref 78.0–100.0)
Platelets: 164 10*3/uL (ref 150–400)
RBC: 4.1 MIL/uL (ref 3.87–5.11)
RDW: 12.8 % (ref 11.5–15.5)
WBC: 4.1 10*3/uL (ref 4.0–10.5)

## 2016-05-28 LAB — TSH: TSH: 8.058 u[IU]/mL — ABNORMAL HIGH (ref 0.350–4.500)

## 2016-05-28 MED ORDER — GADOBENATE DIMEGLUMINE 529 MG/ML IV SOLN
15.0000 mL | Freq: Once | INTRAVENOUS | Status: AC | PRN
  Administered 2016-05-28: 13 mL via INTRAVENOUS

## 2016-05-28 MED ORDER — PROCHLORPERAZINE EDISYLATE 5 MG/ML IJ SOLN
10.0000 mg | Freq: Four times a day (QID) | INTRAMUSCULAR | Status: DC | PRN
  Administered 2016-05-28: 10 mg via INTRAVENOUS
  Filled 2016-05-28: qty 2

## 2016-05-28 MED ORDER — LORAZEPAM 2 MG/ML IJ SOLN
1.0000 mg | Freq: Once | INTRAMUSCULAR | Status: AC
  Administered 2016-05-28: 1 mg via INTRAVENOUS
  Filled 2016-05-28: qty 1

## 2016-05-28 MED ORDER — KETOROLAC TROMETHAMINE 30 MG/ML IJ SOLN
30.0000 mg | Freq: Once | INTRAMUSCULAR | Status: AC
  Administered 2016-05-28: 30 mg via INTRAVENOUS
  Filled 2016-05-28: qty 1

## 2016-05-28 MED ORDER — MORPHINE SULFATE (PF) 4 MG/ML IV SOLN
4.0000 mg | Freq: Once | INTRAVENOUS | Status: AC
  Administered 2016-05-28: 4 mg via INTRAVENOUS
  Filled 2016-05-28: qty 1

## 2016-05-28 MED ORDER — SODIUM CHLORIDE 0.9 % IV BOLUS (SEPSIS)
1000.0000 mL | Freq: Once | INTRAVENOUS | Status: AC
  Administered 2016-05-28: 1000 mL via INTRAVENOUS

## 2016-05-28 NOTE — ED Notes (Signed)
ED Provider at bedside. 

## 2016-05-28 NOTE — ED Triage Notes (Signed)
Pt with headache x 2 weeks.  Pt was given prednisone for 4 days.  When med stopped, came right back.  Light sensitive.

## 2016-05-28 NOTE — ED Provider Notes (Signed)
[facility] DEPT Provider Note   CSN: [id number] Arrival date & time: 05/28/16  1129     History   Chief Complaint Chief Complaint  Patient presents with  . Headache    HPI Laura Brock is a 56 y.o. female.  HPI Patient presents for worsening headache over the past 2 weeks.  She's been on prednisone in tried multiple home medications including Relpax and ketoprofen.  She's had no resolution of her symptoms.  She states this feels slightly different than her typical migraine.  No change in her vision.  Denies weakness in arms or legs.  No fevers or chills.  No recent illness.  No recent head injury.  Symptoms are moderate in severity.   Past Medical History:  Diagnosis Date  . Allergy   . History of anal fissures   . Hypothyroid 07/2010  . Migraine   . Non-celiac gluten sensitivity   . Thyroid disease     Patient Active Problem List   Diagnosis Date Noted  . Abdominal pain 08/01/2015  . Physical exam 01/03/2015  . 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 Hypothyroidism 04/25/2008  . IRREGULAR MENSES 04/25/2008  . OSTEOPENIA 04/25/2008  . HEADACHE 04/25/2008  . FRACTURE, FIBULA, LEFT 04/25/2008    History reviewed. No pertinent surgical history.  OB History    No data available       Home Medications    Prior to Admission medications   Medication Sig Start Date End Date Taking? Authorizing Provider  Ascorbic Acid (VITAMIN C PO) Take 2,000 mg by mouth daily.    Yes Historical Provider, MD  Cholecalciferol (VITAMIN D3) 5000 UNITS CAPS Take 2 capsules by mouth daily.   Yes Historical Provider, MD  Cimetidine (TAGAMET PO) Take 1 tablet by mouth daily as needed (acid reflux).    Yes Historical Provider, MD  clonazePAM (KLONOPIN) 0.5 MG tablet Take 0.5 tablets (0.25 mg total) by mouth 2 (two) times daily as needed for anxiety. 02/10/16  Yes Midge Minium, MD  cyclobenzaprine (FLEXERIL) 10 MG tablet Take 10 mg by mouth 3 (three) times daily as needed for  muscle spasms.   Yes Historical Provider, MD  eletriptan (RELPAX) 40 MG tablet Take 40 mg by mouth as needed for migraine or headache. One tablet by mouth at onset of headache. May repeat in 2 hours if headache persists or recurs.   Yes Historical Provider, MD  ketoprofen (ORUDIS) 75 MG capsule Take 75 mg by mouth 4 (four) times daily as needed (migraines, if not relief then pt will take relpax).    Yes Historical Provider, MD  magnesium aspartate (MAGINEX) 615 MG tablet Take 615 mg by mouth at bedtime.    Yes Historical Provider, MD  Multiple Vitamin (MULTIVITAMIN) tablet Take 1 tablet by mouth daily.   Yes Historical Provider, MD  Omega-3 Fatty Acids (FISH OIL PO) Take 820 mg by mouth 2 (two) times daily.   Yes Historical Provider, MD  promethazine (PHENERGAN) 25 MG tablet Take 1 tablet (25 mg total) by mouth every 6 (six) hours as needed for nausea. 02/18/15  Yes Darlyne Russian, MD  metoCLOPramide (REGLAN) 10 MG tablet Take 1 tablet (10 mg total) by mouth every 6 (six) hours as needed for nausea or vomiting. Patient not taking: Reported on 05/28/2016 02/22/15   Shawnee Knapp, MD  omeprazole (PRILOSEC) 40 MG capsule Take 1 capsule (40 mg total) by mouth daily. Patient not taking: Reported on 05/28/2016 02/10/16   Midge Minium, MD  Progesterone Micronized (PROGESTERONE PO) Take 75 tablets by mouth at bedtime. Reported on 08/01/2015    Historical Provider, MD    Family History Family History  Problem Relation Age of Onset  . Cancer Father   . Diabetes Maternal Grandfather   . Heart disease Paternal Grandfather     Social History Social History  Substance Use Topics  . Smoking status: Never Smoker  . Smokeless tobacco: Never Used  . Alcohol use No     Allergies   Penicillins and Zofran [ondansetron hcl]   Review of Systems Review of Systems  All other systems reviewed and are negative.    Physical Exam Updated Vital Signs BP 99/59 (BP Location: Right Arm)   Pulse 61   Temp  97.8 F (36.6 C) (Oral)   Resp 13   SpO2 99%   Physical Exam  Constitutional: She is oriented to person, place, and time. She appears well-developed and well-nourished. No distress.  HENT:  Head: Normocephalic and atraumatic.  Eyes: EOM are normal. Pupils are equal, round, and reactive to light.  Neck: Normal range of motion.  Cardiovascular: Normal rate, regular rhythm and normal heart sounds.   Pulmonary/Chest: Effort normal and breath sounds normal.  Abdominal: Soft. She exhibits no distension. There is no tenderness.  Musculoskeletal: Normal range of motion.  Neurological: She is alert and oriented to person, place, and time.  5/5 strength in major muscle groups of  bilateral upper and lower extremities. Speech normal. No facial asymetry.   Skin: Skin is warm and dry.  Psychiatric: She has a normal mood and affect. Judgment normal.  Nursing note and vitals reviewed.    ED Treatments / Results  Labs (all labs ordered are listed, but only abnormal results are displayed) Labs Reviewed  BASIC METABOLIC PANEL - Abnormal; Notable for the following:       Result Value   Anion gap 4 (*)    All other components within normal limits  TSH - Abnormal; Notable for the following:    TSH 8.058 (*)    All other components within normal limits  CBC    EKG  EKG Interpretation None       Radiology Ct Head Wo Contrast  Result Date: 05/28/2016 CLINICAL DATA:  Headache for 2 weeks. Prednisone for 4 days helped, but symptoms immediately returned. EXAM: CT HEAD WITHOUT CONTRAST TECHNIQUE: Contiguous axial images were obtained from the base of the skull through the vertex without intravenous contrast. COMPARISON:  08/09/2010. FINDINGS: Brain: No evidence for acute infarction, hemorrhage, intra-axial mass lesion, hydrocephalus, or extra-axial fluid. Normal cerebral volume. No white matter disease. Asymmetric fullness along the LEFT side of the superior sagittal sinus, anterior parietal  region, see coronal image 44, could represent early meningioma formation. Difficult to assess for interval change from prior, as coronal imaging not performed. Vascular: No hyperdense vessel or unexpected calcification. Skull: Normal. Negative for fracture or focal lesion. Sinuses/Orbits: No acute finding. Other: None. IMPRESSION: No acute intracranial findings. Asymmetric fullness along the LEFT side of the superior sagittal sinus, anterior parietal region, could represent early meningioma formation.If further investigation desired, consider MRI brain without and with contrast. This could be performed on an elective basis. Electronically Signed   By: Staci Righter M.D.   On: 05/28/2016 13:24   Mr Brain W And Wo Contrast  Result Date: 05/28/2016 CLINICAL DATA:  Worsening headache. Question meningioma on head CT today. EXAM: MRI HEAD WITHOUT AND WITH CONTRAST TECHNIQUE: Multiplanar, multiecho pulse sequences of the brain  and surrounding structures were obtained without and with intravenous contrast. CONTRAST:  52mL MULTIHANCE GADOBENATE DIMEGLUMINE 529 MG/ML IV SOLN COMPARISON:  Head CT from earlier today FINDINGS: Brain: No acute or remote infarction, hemorrhage, hydrocephalus, extra-axial collection or mass lesion. No atrophy or white matter disease. Vascular: Normal flow voids. Skull and upper cervical spine: Normal marrow signal. Sinuses/Orbits: Negative. IMPRESSION: Normal brain MRI; no explanation for headache. Negative for meningioma. Electronically Signed   By: Monte Fantasia M.D.   On: 05/28/2016 15:10    Procedures Procedures (including critical care time)  Medications Ordered in ED Medications  prochlorperazine (COMPAZINE) injection 10 mg (10 mg Intravenous Given 05/28/16 1218)  morphine 4 MG/ML injection 4 mg (4 mg Intravenous Given 05/28/16 1218)  ketorolac (TORADOL) 30 MG/ML injection 30 mg (30 mg Intravenous Given 05/28/16 1218)  sodium chloride 0.9 % bolus 1,000 mL (0 mLs Intravenous Stopped  05/28/16 1357)  LORazepam (ATIVAN) injection 1 mg (1 mg Intravenous Given 05/28/16 1409)  gadobenate dimeglumine (MULTIHANCE) injection 15 mL (13 mLs Intravenous Contrast Given 05/28/16 1453)     Initial Impression / Assessment and Plan / ED Course  I have reviewed the triage vital signs and the nursing notes.  Pertinent labs & imaging results that were available during my care of the patient were reviewed by me and considered in my medical decision making (see chart for details).     Patient feels much better this time.  Resolution of her headache.  Likely atypical migraine.  Initial head CT showed possible meningioma.  She underwent MRI which is normal.  Both she and her husband have been informed of this.  Close primary care follow-up.  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

## 2016-05-28 NOTE — ED Notes (Signed)
Patient transported to MRI 

## 2016-05-28 NOTE — ED Notes (Signed)
Patient returned from MRI.

## 2016-06-02 ENCOUNTER — Telehealth: Payer: Self-pay | Admitting: Family Medicine

## 2016-06-02 DIAGNOSIS — E038 Other specified hypothyroidism: Secondary | ICD-10-CM

## 2016-06-02 NOTE — Telephone Encounter (Signed)
Patient requesting referral to endocrinologist for thyroid.  She contacted [facility] to scheduled her own appt but was informed they needed a referral from pcp.  Patient requesting to see:  Dr. [facility] [address], [place] [address] Tel # [phone]

## 2016-06-02 NOTE — Telephone Encounter (Signed)
I am happy to refer her but we can also manage her thyroid here if she is interested.  Based on her labs, I would start her on 90mcg Levothyroxine daily and repeat her TSH level at an appt here in 1 month

## 2016-06-02 NOTE — Telephone Encounter (Signed)
Please advise, pt had CPE in October  TSH 1 year ago 6.5 4 mons ago 0.99 5 days ago 8.058

## 2016-06-02 NOTE — Telephone Encounter (Signed)
Spoke with pt, she would prefer to see the endocrinologist in [place]. Referral placed.

## 2016-07-13 ENCOUNTER — Encounter: Payer: Self-pay | Admitting: Family Medicine

## 2016-07-13 ENCOUNTER — Ambulatory Visit (INDEPENDENT_AMBULATORY_CARE_PROVIDER_SITE_OTHER): Payer: BC Managed Care – PPO | Admitting: Family Medicine

## 2016-07-13 VITALS — BP 101/72 | HR 68 | Temp 98.0°F | Resp 17 | Ht 65.0 in | Wt 137.2 lb

## 2016-07-13 DIAGNOSIS — T753XXA Motion sickness, initial encounter: Secondary | ICD-10-CM | POA: Insufficient documentation

## 2016-07-13 DIAGNOSIS — E038 Other specified hypothyroidism: Secondary | ICD-10-CM | POA: Diagnosis not present

## 2016-07-13 MED ORDER — PREDNISONE 10 MG PO TABS: ORAL_TABLET | ORAL | 0 refills | Status: DC

## 2016-07-13 MED ORDER — SCOPOLAMINE 1 MG/3DAYS TD PT72
1.0000 | MEDICATED_PATCH | TRANSDERMAL | 0 refills | Status: DC
Start: 2016-07-13 — End: 2017-01-06

## 2016-07-13 NOTE — Progress Notes (Signed)
   Subjective:    Patient ID: Laura Brock, female    DOB: Oct 24, 1960, 56 y.o.   MRN: [id number]  HPI Travel advice- pt has a trip upcoming to [place].  She has hx of motion sickness and is asking for medication.  Hypothyroid- chronic problem, [facility] who tells pt it's not her thyroid but rather her adrenal glands.  Pt was started on Pregnenolone which caused headaches.  Pt had asked for appt to Endo in [place]- appt upcoming 5/1.  A few days ago pt restarted 15mg  of 'compounded thyroid medication'.  Pt reports when she increased dose to 30mg  daily she developed severe headache.  Pt plans to restart at 15mg  tomorrow and take every other day for a few days.   Review of Systems For ROS see HPI     Objective:   Physical Exam  Constitutional: She is oriented to person, place, and time. She appears well-developed and well-nourished. No distress.  HENT:  Head: Normocephalic and atraumatic.  Eyes: Conjunctivae and EOM are normal. Pupils are equal, round, and reactive to light.  Neck: Normal range of motion. Neck supple. No thyromegaly present.  Cardiovascular: Normal rate, regular rhythm, normal heart sounds and intact distal pulses.   No murmur heard. Pulmonary/Chest: Effort normal and breath sounds normal. No respiratory distress.  Abdominal: Soft. She exhibits no distension. There is no tenderness.  Musculoskeletal: She exhibits no edema.  Lymphadenopathy:    She has no cervical adenopathy.  Neurological: She is alert and oriented to person, place, and time.  Skin: Skin is warm and dry.  Psychiatric: She has a normal mood and affect. Her behavior is normal.  Vitals reviewed.         Assessment & Plan:

## 2016-07-13 NOTE — Assessment & Plan Note (Addendum)
Chronic problem.  Pt is awaiting Endo appt w/ Dr in [place].  She recently restarted her compounded thyroid medication at 15mg  daily but developed severe headaches when she increased to 30mg  daily.  Reviewed her recent labs and had a discussion that her pregnenolone levels would not impact her TSH- despite what her naturopath told her.  Reviewed HPT axis with her.  He has been telling her not to treat her thyroid as he didn't feel this was related to her ongoing fatigue.  Told her that it was more likely to be thyroid than adrenal but that was a discussion between her and her provider.  Encouraged her to keep her appt w/ Endo to get another opinion.  Will follow.  Total time spent w/ pt- 30 minutes, >50% spent counseling

## 2016-07-13 NOTE — Patient Instructions (Signed)
Follow up as needed/scheduled Start the Scopolamine prior to flight and change the patch every 3 days.  Make sure you wash your hands after application Drink plenty of fluids Take the Thyroid Medication 15mg  daily until you see the Endocrinologist Use the Prednisone as needed for migraine while you are traveling Call with any questions or concerns Hang in there!!!

## 2016-07-13 NOTE — Assessment & Plan Note (Signed)
Pt has hx of motion sickness and is going on a cruise in May.  Provided prescription for Scopolamine along w/ instructions on use.

## 2016-07-13 NOTE — Progress Notes (Signed)
Pre visit review using our clinic review tool, if applicable. No additional management support is needed unless otherwise documented below in the visit note. 

## 2016-07-29 DIAGNOSIS — K219 Gastro-esophageal reflux disease without esophagitis: Secondary | ICD-10-CM | POA: Insufficient documentation

## 2016-07-29 DIAGNOSIS — K5900 Constipation, unspecified: Secondary | ICD-10-CM | POA: Insufficient documentation

## 2016-07-29 DIAGNOSIS — Z791 Long term (current) use of non-steroidal anti-inflammatories (NSAID): Secondary | ICD-10-CM | POA: Insufficient documentation

## 2016-07-29 DIAGNOSIS — R1013 Epigastric pain: Secondary | ICD-10-CM | POA: Insufficient documentation

## 2016-08-11 ENCOUNTER — Other Ambulatory Visit: Payer: Self-pay | Admitting: Family Medicine

## 2016-08-11 MED ORDER — CLONAZEPAM 0.5 MG PO TABS: 0.2500 mg | ORAL_TABLET | Freq: Two times a day (BID) | ORAL | 1 refills | Status: DC | PRN

## 2016-08-11 NOTE — Telephone Encounter (Signed)
Medication filled to pharmacy as requested.   

## 2016-08-11 NOTE — Telephone Encounter (Signed)
Patient requesting refill of clonazePAM (KLONOPIN) 0.5 MG tablet.   Pharmacy: [facility] [place], [place] [phone] (Phone) [phone] (Fax)

## 2016-08-11 NOTE — Telephone Encounter (Signed)
Last OV 07/13/16 Clonazepam last filled 02/10/16 #30 with 1

## 2016-08-18 ENCOUNTER — Telehealth: Payer: Self-pay | Admitting: *Deleted

## 2016-08-18 NOTE — Telephone Encounter (Signed)
Patient calling to see if there is a pill form of carafate that could be called in for a trip she is going on next week.  Patient states that she is not able to take the liquid with her and is looking for a different option while she is gone.  Requested a call back on her cell number

## 2016-08-19 MED ORDER — SUCRALFATE 1 G PO TABS: 1.0000 g | ORAL_TABLET | Freq: Three times a day (TID) | ORAL | 0 refills | Status: DC

## 2016-08-19 NOTE — Telephone Encounter (Signed)
Ok to switch to tablet form of carafate, 1 tab TID prior to meals, #90, no refills

## 2016-08-19 NOTE — Addendum Note (Signed)
Addended by: Davis Gourd on: 08/19/2016 08:34 AM   Modules accepted: Orders

## 2016-08-19 NOTE — Telephone Encounter (Signed)
New rx sent, will call and inform pt.

## 2017-01-06 ENCOUNTER — Encounter: Payer: Self-pay | Admitting: General Practice

## 2017-01-06 ENCOUNTER — Encounter: Payer: Self-pay | Admitting: Family Medicine

## 2017-01-06 ENCOUNTER — Ambulatory Visit (INDEPENDENT_AMBULATORY_CARE_PROVIDER_SITE_OTHER): Payer: BC Managed Care – PPO | Admitting: Family Medicine

## 2017-01-06 VITALS — BP 106/80 | HR 73 | Temp 97.9°F | Resp 16 | Ht 65.0 in | Wt 138.0 lb

## 2017-01-06 DIAGNOSIS — E038 Other specified hypothyroidism: Secondary | ICD-10-CM | POA: Diagnosis not present

## 2017-01-06 DIAGNOSIS — M8580 Other specified disorders of bone density and structure, unspecified site: Secondary | ICD-10-CM | POA: Diagnosis not present

## 2017-01-06 DIAGNOSIS — Z0000 Encounter for general adult medical examination without abnormal findings: Secondary | ICD-10-CM

## 2017-01-06 LAB — CBC WITH DIFFERENTIAL/PLATELET
BASOS PCT: 0.8 % (ref 0.0–3.0)
Basophils Absolute: 0 10*3/uL (ref 0.0–0.1)
EOS ABS: 0.1 10*3/uL (ref 0.0–0.7)
EOS PCT: 2.9 % (ref 0.0–5.0)
HEMATOCRIT: 38.9 % (ref 36.0–46.0)
HEMOGLOBIN: 13 g/dL (ref 12.0–15.0)
LYMPHS PCT: 28.2 % (ref 12.0–46.0)
Lymphs Abs: 1.3 10*3/uL (ref 0.7–4.0)
MCHC: 33.4 g/dL (ref 30.0–36.0)
MCV: 95.8 fl (ref 78.0–100.0)
MONOS PCT: 10.8 % (ref 3.0–12.0)
Monocytes Absolute: 0.5 10*3/uL (ref 0.1–1.0)
Neutro Abs: 2.7 10*3/uL (ref 1.4–7.7)
Neutrophils Relative %: 57.3 % (ref 43.0–77.0)
Platelets: 172 10*3/uL (ref 150.0–400.0)
RBC: 4.06 Mil/uL (ref 3.87–5.11)
RDW: 13.4 % (ref 11.5–15.5)
WBC: 4.8 10*3/uL (ref 4.0–10.5)

## 2017-01-06 LAB — BASIC METABOLIC PANEL
BUN: 20 mg/dL (ref 6–23)
CALCIUM: 9.2 mg/dL (ref 8.4–10.5)
CO2: 30 mEq/L (ref 19–32)
CREATININE: 0.78 mg/dL (ref 0.40–1.20)
Chloride: 105 mEq/L (ref 96–112)
GFR: 80.81 mL/min (ref >60.00)
GLUCOSE: 75 mg/dL (ref 70–99)
POTASSIUM: 4.2 meq/L (ref 3.5–5.1)
Sodium: 140 mEq/L (ref 135–145)

## 2017-01-06 LAB — LIPID PANEL
CHOL/HDL RATIO: 3
Cholesterol: 190 mg/dL (ref 0–200)
HDL: 58.6 mg/dL (ref >39.00)
LDL CALC: 117 mg/dL — AB (ref 0–99)
NonHDL: 131.14
Triglycerides: 69 mg/dL (ref 0.0–149.0)
VLDL: 13.8 mg/dL (ref 0.0–40.0)

## 2017-01-06 LAB — HEPATIC FUNCTION PANEL
ALT: 26 U/L (ref 0–35)
AST: 27 U/L (ref 0–37)
Albumin: 4.4 g/dL (ref 3.5–5.2)
Alkaline Phosphatase: 60 U/L (ref 39–117)
BILIRUBIN TOTAL: 0.4 mg/dL (ref 0.2–1.2)
Bilirubin, Direct: 0.1 mg/dL (ref 0.0–0.3)
Total Protein: 6.8 g/dL (ref 6.0–8.3)

## 2017-01-06 LAB — TSH: TSH: 7.16 u[IU]/mL — AB (ref 0.35–4.50)

## 2017-01-06 LAB — VITAMIN D 25 HYDROXY (VIT D DEFICIENCY, FRACTURES): VITD: 52.56 ng/mL (ref 30.00–100.00)

## 2017-01-06 MED ORDER — CLONAZEPAM 0.5 MG PO TABS: 0.2500 mg | ORAL_TABLET | Freq: Two times a day (BID) | ORAL | 1 refills | Status: DC | PRN

## 2017-01-06 NOTE — Assessment & Plan Note (Signed)
Check Vit D.  Replete prn.

## 2017-01-06 NOTE — Assessment & Plan Note (Signed)
Chronic problem.  Continues to struggle b/c she reports that any thyroid formulation she tries causes severe headache by day 10-15.  She has seen multiple endocrinologists and integrative specialists.  Encouraged her to try and push through the first 30 days of medication to allow her body to reach steady state.  Will follow.

## 2017-01-06 NOTE — Progress Notes (Signed)
   Subjective:    Patient ID: Laura Brock, female    DOB: 1960/07/23, 56 y.o.   MRN: [id number]  HPI CPE- UTD on colonoscopy, pap.  Mammo scheduled for December.  UTD on Tdap.  Pt declines flu shot.   Review of Systems Patient reports no vision/ hearing changes, adenopathy,fever, weight change,  persistant/recurrent hoarseness , swallowing issues, chest pain, palpitations, edema, persistant/recurrent cough, hemoptysis, dyspnea (rest/exertional/paroxysmal nocturnal), gastrointestinal bleeding (melena, rectal bleeding), abdominal pain, significant heartburn, bowel changes, GU symptoms (dysuria, hematuria, incontinence), Gyn symptoms (abnormal  bleeding, pain),  syncope, focal weakness, memory loss, numbness & tingling, skin/hair/nail changes, abnormal bruising or bleeding, anxiety, or depression.     Objective:   Physical Exam General Appearance:    Alert, cooperative, no distress, appears stated age  Head:    Normocephalic, without obvious abnormality, atraumatic  Eyes:    PERRL, conjunctiva/corneas clear, EOM's intact, fundi    benign, both eyes  Ears:    Normal TM's and external ear canals, both ears  Nose:   Nares normal, septum midline, mucosa normal, no drainage    or sinus tenderness  Throat:   Lips, mucosa, and tongue normal; teeth and gums normal  Neck:   Supple, symmetrical, trachea midline, no adenopathy;    Thyroid: no enlargement/tenderness/nodules  Back:     Symmetric, no curvature, ROM normal, no CVA tenderness  Lungs:     Clear to auscultation bilaterally, respirations unlabored  Chest Wall:    No tenderness or deformity   Heart:    Regular rate and rhythm, S1 and S2 normal, no murmur, rub   or gallop  Breast Exam:    Deferred to GYN  Abdomen:     Soft, non-tender, bowel sounds active all four quadrants,    no masses, no organomegaly  Genitalia:    Deferred to GYN  Rectal:    Extremities:   Extremities normal, atraumatic, no cyanosis or edema  Pulses:   2+ and  symmetric all extremities  Skin:   Skin color, texture, turgor normal, no rashes or lesions  Lymph nodes:   Cervical, supraclavicular, and axillary nodes normal  Neurologic:   CNII-XII intact, normal strength, sensation and reflexes    throughout          Assessment & Plan:

## 2017-01-06 NOTE — Assessment & Plan Note (Signed)
Pt's PE WNL.  UTD on pap, mammo is scheduled for December.  UTD on colonoscopy.  Declines flu shot.  Check labs.  Anticipatory guidance provided.

## 2017-01-06 NOTE — Patient Instructions (Signed)
Follow up in 1 year or as needed We'll notify you of your lab results and make any changes if needed Continue to work on healthy diet and regular exercise- you can do it!! I would consider trying to push through that first month of medication to get your body to a steady state Call with any questions or concerns Happy Fall!!!

## 2017-06-02 ENCOUNTER — Other Ambulatory Visit: Payer: Self-pay

## 2017-06-02 ENCOUNTER — Encounter: Payer: Self-pay | Admitting: Physician Assistant

## 2017-06-02 ENCOUNTER — Ambulatory Visit: Payer: BC Managed Care – PPO | Admitting: Physician Assistant

## 2017-06-02 VITALS — BP 114/72 | HR 67 | Resp 16 | Ht 65.0 in | Wt 138.4 lb

## 2017-06-02 DIAGNOSIS — J069 Acute upper respiratory infection, unspecified: Secondary | ICD-10-CM

## 2017-06-02 DIAGNOSIS — B9789 Other viral agents as the cause of diseases classified elsewhere: Secondary | ICD-10-CM | POA: Diagnosis not present

## 2017-06-02 LAB — POC INFLUENZA A&B (BINAX/QUICKVUE)
Influenza A, POC: NEGATIVE
Influenza B, POC: NEGATIVE

## 2017-06-02 MED ORDER — BENZONATATE 100 MG PO CAPS: 100.0000 mg | ORAL_CAPSULE | Freq: Three times a day (TID) | ORAL | 0 refills | Status: DC | PRN

## 2017-06-02 MED ORDER — AZITHROMYCIN 250 MG PO TABS: ORAL_TABLET | ORAL | 0 refills | Status: DC

## 2017-06-02 NOTE — Patient Instructions (Addendum)
Please stay well-hydrated and get plenty of rest. Use Mucinex-DM to help with cough and thin out congestion.  I am sending in a prescription cough medication for you to use as well.  Symptoms and exam do seem consistent with a viral bronchitis. This will take up to 7-10 days to ease itself off. Usually day 3-4 are the worst and then things ease off some.   We try to avoid unnecessary antibiotics to lessen risk of antibiotic resistance.  Giving that symptoms are changing in nature, I want you to follow the instructions above for the next 48 hours as symptoms should ease off. If they are continuing to worsen or new symptoms develop, fill the antibiotic given and take as directed.

## 2017-06-02 NOTE — Progress Notes (Signed)
Patient presents to clinic today c/o 4 days of nasal congestion, body aches, chest congestion and cough that is mainly dry but sometimes productive of clear sputum.  Patient endorses cough is much worse at night and is keeping her from sleep.  Patient denies fever, chest pain or shortness of breath.  Denies recent travel or sick contact.  Has been taking some over-the-counter generic cough medications with only minimal relief in symptoms.  Past Medical History:  Diagnosis Date  . Allergy   . History of anal fissures   . Hypothyroid 07/2010  . Migraine   . Non-celiac gluten sensitivity   . Thyroid disease     Current Outpatient Medications on File Prior to Visit  Medication Sig Dispense Refill  . ARMOUR THYROID 15 MG tablet Take 15 mg by mouth every morning.  3  . Ascorbic Acid (VITAMIN C PO) Take 2,000 mg by mouth daily.     . Cholecalciferol (VITAMIN D3) 5000 UNITS CAPS Take 2 capsules by mouth daily.    . Cimetidine (TAGAMET PO) Take 1 tablet by mouth daily as needed (acid reflux).     . clonazePAM (KLONOPIN) 0.5 MG tablet Take 0.5 tablets (0.25 mg total) by mouth 2 (two) times daily as needed for anxiety. 30 tablet 1  . cyclobenzaprine (FLEXERIL) 10 MG tablet Take 10 mg by mouth 3 (three) times daily as needed for muscle spasms.    Marland Kitchen DIGESTIVE ENZYMES PO Take by mouth daily.    Marland Kitchen eletriptan (RELPAX) 40 MG tablet Take 40 mg by mouth as needed for migraine or headache. One tablet by mouth at onset of headache. May repeat in 2 hours if headache persists or recurs.    . magnesium aspartate (MAGINEX) 615 MG tablet Take 615 mg by mouth at bedtime.     . metoCLOPramide (REGLAN) 10 MG tablet Take 1 tablet (10 mg total) by mouth every 6 (six) hours as needed for nausea or vomiting. 30 tablet 1  . Multiple Vitamin (MULTIVITAMIN) tablet Take 1 tablet by mouth daily.    . Omega-3 Fatty Acids (FISH OIL PO) Take 820 mg by mouth 2 (two) times daily.    . promethazine (PHENERGAN) 25 MG tablet Take 1  tablet (25 mg total) by mouth every 6 (six) hours as needed for nausea. 12 tablet 0  . ketoprofen (ORUDIS) 75 MG capsule Take 75 mg by mouth 4 (four) times daily as needed (migraines, if not relief then pt will take relpax).      No current facility-administered medications on file prior to visit.     Allergies  Allergen Reactions  . Latex Rash  . Penicillins Swelling    Has patient had a PCN reaction causing immediate rash, facial/tongue/throat swelling, SOB or lightheadedness with hypotension: Yes Has patient had a PCN reaction causing severe rash involving mucus membranes or skin necrosis: No Has patient had a PCN reaction that required hospitalization Yes- pcp visit Has patient had a PCN reaction occurring within the last 10 years: no, childhood allergy If all of the above answers are "NO", then may proceed with Cephalosporin use.   Marland Kitchen Zofran [Ondansetron Hcl]     Patient has severe headaches with Zofran.    Family History  Problem Relation Age of Onset  . Cancer Father   . Diabetes Maternal Grandfather   . Heart disease Paternal Grandfather     Social History   Socioeconomic History  . Marital status: Married    Spouse name: None  . Number  of children: None  . Years of education: None  . Highest education level: None  Social Needs  . Financial resource strain: None  . Food insecurity - worry: None  . Food insecurity - inability: None  . Transportation needs - medical: None  . Transportation needs - non-medical: None  Occupational History  . None  Tobacco Use  . Smoking status: Never Smoker  . Smokeless tobacco: Never Used  Substance and Sexual Activity  . Alcohol use: No  . Drug use: No  . Sexual activity: None  Other Topics Concern  . None  Social History Narrative  . None   Review of Systems - See HPI.  All other ROS are negative.  BP 114/72   Pulse 67   Resp 16   Ht 5\' 5"  (1.651 m)   Wt 138 lb 6.4 oz (62.8 kg)   SpO2 99%   BMI 23.03 kg/m    Physical Exam  Constitutional: She is oriented to person, place, and time and well-developed, well-nourished, and in no distress.  HENT:  Head: Normocephalic and atraumatic.  Right Ear: External ear normal.  Left Ear: External ear normal.  Nose: Nose normal.  Mouth/Throat: Oropharynx is clear and moist. No oropharyngeal exudate.  TM within normal limits bilaterally.  Eyes: Conjunctivae are normal.  Neck: Neck supple.  Cardiovascular: Normal rate, regular rhythm, normal heart sounds and intact distal pulses.  Pulmonary/Chest: Effort normal and breath sounds normal. No respiratory distress. She has no wheezes. She has no rales. She exhibits no tenderness.  Neurological: She is alert and oriented to person, place, and time.  Skin: Skin is warm and dry. No rash noted.  Psychiatric: Affect normal.  Vitals reviewed.  Assessment/Plan: 1. Viral URI with cough Flu swab negative.  Patient afebrile.  Exam without alarming findings.  Suspect viral etiology although with new productivity of cough, may be a start of an early bacterial bronchitis.  Will start supportive measures.  OTC medications reviewed.  Rx Tessalon for cough.  Patient instructed to follow recommendations in the next 48 hours.  If symptoms are not leveling off or if the cough becomes more productive, or new symptoms develop, she is to start taking the antibiotic prescription I have printed for her. - POC Influenza A&B(BINAX/QUICKVUE)   Leeanne Rio, PA-C

## 2017-10-05 ENCOUNTER — Ambulatory Visit: Payer: BC Managed Care – PPO | Admitting: Physician Assistant

## 2017-10-05 ENCOUNTER — Other Ambulatory Visit: Payer: Self-pay

## 2017-10-05 ENCOUNTER — Encounter: Payer: Self-pay | Admitting: Physician Assistant

## 2017-10-05 VITALS — BP 108/72 | HR 59 | Temp 97.7°F | Resp 16 | Ht 65.0 in | Wt 140.4 lb

## 2017-10-05 DIAGNOSIS — R1013 Epigastric pain: Secondary | ICD-10-CM | POA: Diagnosis not present

## 2017-10-05 DIAGNOSIS — K219 Gastro-esophageal reflux disease without esophagitis: Secondary | ICD-10-CM

## 2017-10-05 LAB — COMPREHENSIVE METABOLIC PANEL
ALT: 18 U/L (ref 0–35)
AST: 20 U/L (ref 0–37)
Albumin: 4.5 g/dL (ref 3.5–5.2)
Alkaline Phosphatase: 50 U/L (ref 39–117)
BILIRUBIN TOTAL: 0.5 mg/dL (ref 0.2–1.2)
BUN: 15 mg/dL (ref 6–23)
CALCIUM: 9.1 mg/dL (ref 8.4–10.5)
CO2: 30 meq/L (ref 19–32)
CREATININE: 0.76 mg/dL (ref 0.40–1.20)
Chloride: 106 mEq/L (ref 96–112)
GFR: 83.05 mL/min (ref >60.00)
GLUCOSE: 79 mg/dL (ref 70–99)
Potassium: 4 mEq/L (ref 3.5–5.1)
Sodium: 141 mEq/L (ref 135–145)
Total Protein: 7 g/dL (ref 6.0–8.3)

## 2017-10-05 LAB — H. PYLORI ANTIBODY, IGG: H Pylori IgG: NEGATIVE

## 2017-10-05 LAB — LIPASE: Lipase: 30 U/L (ref 11.0–59.0)

## 2017-10-05 MED ORDER — SUCRALFATE 1 G PO TABS: 1.0000 g | ORAL_TABLET | Freq: Three times a day (TID) | ORAL | 0 refills | Status: DC

## 2017-10-05 MED ORDER — PANTOPRAZOLE SODIUM 40 MG PO TBEC: 40.0000 mg | DELAYED_RELEASE_TABLET | Freq: Every day | ORAL | 3 refills | Status: DC

## 2017-10-05 NOTE — Progress Notes (Signed)
Patient presents to clinic today c/o intermittent heartburn/indigenstion associated with mild epigastric discomfort x 1 year. Notes for this she has been taking OTC Tagamet on rare occasion when symptoms are very severe. Notes over the past month symptoms have been more frequent and more intense in severity. Notes burning pain, especially at night. Worse with alcohol (consumes rarely). Denies any racing heart, dizziness, SOB. Is currently taking aloe vera juice and DGL supplement recommended by her integrative specialist which is only slightly helping. Has been taking Prilosec   Past Medical History:  Diagnosis Date  . Allergy   . History of anal fissures   . Hypothyroid 07/2010  . Migraine   . Non-celiac gluten sensitivity   . Thyroid disease     Current Outpatient Medications on File Prior to Visit  Medication Sig Dispense Refill  . ARMOUR THYROID 15 MG tablet Take 15 mg by mouth every morning.  3  . Ascorbic Acid (VITAMIN C PO) Take 2,000 mg by mouth daily.     . Cholecalciferol (VITAMIN D3) 5000 UNITS CAPS Take 2 capsules by mouth daily.    . Cimetidine (TAGAMET PO) Take 1 tablet by mouth daily as needed (acid reflux).     . clonazePAM (KLONOPIN) 0.5 MG tablet Take 0.5 tablets (0.25 mg total) by mouth 2 (two) times daily as needed for anxiety. 30 tablet 1  . cyclobenzaprine (FLEXERIL) 10 MG tablet Take 10 mg by mouth 3 (three) times daily as needed for muscle spasms.    Marland Kitchen DIGESTIVE ENZYMES PO Take by mouth daily.    Marland Kitchen eletriptan (RELPAX) 40 MG tablet Take 40 mg by mouth as needed for migraine or headache. One tablet by mouth at onset of headache. May repeat in 2 hours if headache persists or recurs.    Marland Kitchen ketoprofen (ORUDIS) 75 MG capsule Take 75 mg by mouth 4 (four) times daily as needed (migraines, if not relief then pt will take relpax).     . magnesium aspartate (MAGINEX) 615 MG tablet Take 615 mg by mouth at bedtime.     . metoCLOPramide (REGLAN) 10 MG tablet Take 1 tablet (10 mg  total) by mouth every 6 (six) hours as needed for nausea or vomiting. 30 tablet 1  . Multiple Vitamin (MULTIVITAMIN) tablet Take 1 tablet by mouth daily.    . Omega-3 Fatty Acids (FISH OIL PO) Take 820 mg by mouth 2 (two) times daily.    . promethazine (PHENERGAN) 25 MG tablet Take 1 tablet (25 mg total) by mouth every 6 (six) hours as needed for nausea. 12 tablet 0   No current facility-administered medications on file prior to visit.     Allergies  Allergen Reactions  . Latex Rash  . Penicillins Swelling    Has patient had a PCN reaction causing immediate rash, facial/tongue/throat swelling, SOB or lightheadedness with hypotension: Yes Has patient had a PCN reaction causing severe rash involving mucus membranes or skin necrosis: No Has patient had a PCN reaction that required hospitalization Yes- pcp visit Has patient had a PCN reaction occurring within the last 10 years: no, childhood allergy If all of the above answers are "NO", then may proceed with Cephalosporin use.   Marland Kitchen Zofran [Ondansetron Hcl]     Patient has severe headaches with Zofran.    Family History  Problem Relation Age of Onset  . Cancer Father   . Diabetes Maternal Grandfather   . Heart disease Paternal Grandfather     Social History   Socioeconomic  History  . Marital status: Married    Spouse name: Not on file  . Number of children: Not on file  . Years of education: Not on file  . Highest education level: Not on file  Occupational History  . Not on file  Social Needs  . Financial resource strain: Not on file  . Food insecurity:    Worry: Not on file    Inability: Not on file  . Transportation needs:    Medical: Not on file    Non-medical: Not on file  Tobacco Use  . Smoking status: Never Smoker  . Smokeless tobacco: Never Used  Substance and Sexual Activity  . Alcohol use: No  . Drug use: No  . Sexual activity: Not on file  Lifestyle  . Physical activity:    Days per week: Not on file     Minutes per session: Not on file  . Stress: Not on file  Relationships  . Social connections:    Talks on phone: Not on file    Gets together: Not on file    Attends religious service: Not on file    Active member of club or organization: Not on file    Attends meetings of clubs or organizations: Not on file    Relationship status: Not on file  Other Topics Concern  . Not on file  Social History Narrative  . Not on file   Review of Systems - See HPI.  All other ROS are negative.  BP 108/72   Pulse (!) 59   Temp 97.7 F (36.5 C) (Oral)   Resp 16   Ht 5' 5"  (1.651 m)   Wt 140 lb 6.4 oz (63.7 kg)   SpO2 99%   BMI 23.36 kg/m   Physical Exam  Constitutional: She appears well-developed and well-nourished.  HENT:  Head: Normocephalic and atraumatic.  Eyes: Conjunctivae are normal.  Neck: Neck supple.  Cardiovascular: Normal rate, regular rhythm and normal heart sounds.  Pulmonary/Chest: Effort normal and breath sounds normal. No stridor. No respiratory distress. She has no wheezes. She has no rales. She exhibits no tenderness.  Abdominal: Soft. Bowel sounds are normal. There is no hepatomegaly. There is tenderness in the epigastric area. No hernia.  Psychiatric: She has a normal mood and affect.  Vitals reviewed.  Assessment/Plan: 1. Gastroesophageal reflux disease without esophagitis 2. Epigastric pain  Mild epigastric tenderness on exam. Concern for hiatal hernia. Will start on Carafate for a few days while getting her on Protonix 40 mg daily. GERD diet reviewed. Will check CMP, Lipase and H. Pylori IgG today. If negative will set up with GI for EGD and further management of chronic symptoms.  - pantoprazole (PROTONIX) 40 MG tablet; Take 1 tablet (40 mg total) by mouth daily.  Dispense: 30 tablet; Refill: 3 - sucralfate (CARAFATE) 1 g tablet; Take 1 tablet (1 g total) by mouth 4 (four) times daily -  with meals and at bedtime.  Dispense: 30 tablet; Refill: 0 - Comp Met  (CMET) - Lipase - H. pylori antibody, IgG   Leeanne Rio, PA-C

## 2017-10-05 NOTE — Patient Instructions (Signed)
Please go to the lab today for blood work.  I will call you with your results. We will alter treatment regimen(s) if indicated by your results.   Please keep well-hydrated and follow the dietary recommendations below. Start the Carafate as directed for 3 days to help coat the stomach and duodenum -- this should help with pain. Start the Protonix as directed for at least 2 weeks.  I am getting you set up with Dr. Hilarie Fredrickson and associates for further assessment.    Food Choices for Gastroesophageal Reflux Disease, Adult When you have gastroesophageal reflux disease (GERD), the foods you eat and your eating habits are very important. Choosing the right foods can help ease your discomfort. What guidelines do I need to follow?  Choose fruits, vegetables, whole grains, and low-fat dairy products.  Choose low-fat meat, fish, and poultry.  Limit fats such as oils, salad dressings, butter, nuts, and avocado.  Keep a food diary. This helps you identify foods that cause symptoms.  Avoid foods that cause symptoms. These may be different for everyone.  Eat small meals often instead of 3 large meals a day.  Eat your meals slowly, in a place where you are relaxed.  Limit fried foods.  Cook foods using methods other than frying.  Avoid drinking alcohol.  Avoid drinking large amounts of liquids with your meals.  Avoid bending over or lying down until 2-3 hours after eating. What foods are not recommended? These are some foods and drinks that may make your symptoms worse: Vegetables Tomatoes. Tomato juice. Tomato and spaghetti sauce. Chili peppers. Onion and garlic. Horseradish. Fruits Oranges, grapefruit, and lemon (fruit and juice). Meats High-fat meats, fish, and poultry. This includes hot dogs, ribs, ham, sausage, salami, and bacon. Dairy Whole milk and chocolate milk. Sour cream. Cream. Butter. Ice cream. Cream cheese. Drinks Coffee and tea. Bubbly (carbonated) drinks or energy  drinks. Condiments Hot sauce. Barbecue sauce. Sweets/Desserts Chocolate and cocoa. Donuts. Peppermint and spearmint. Fats and Oils High-fat foods. This includes [place] fries and potato chips. Other Vinegar. Strong spices. This includes black pepper, white pepper, red pepper, cayenne, curry powder, cloves, ginger, and chili powder. The items listed above may not be a complete list of foods and drinks to avoid. Contact your dietitian for more information. This information is not intended to replace advice given to you by your health care provider. Make sure you discuss any questions you have with your health care provider. Document Released: 09/15/2011 Document Revised: 08/22/2015 Document Reviewed: 01/18/2013 Elsevier Interactive Patient Education  2017 [employer].

## 2017-10-06 ENCOUNTER — Other Ambulatory Visit: Payer: Self-pay

## 2017-10-06 DIAGNOSIS — K219 Gastro-esophageal reflux disease without esophagitis: Secondary | ICD-10-CM

## 2017-10-06 DIAGNOSIS — R1013 Epigastric pain: Secondary | ICD-10-CM

## 2017-10-07 ENCOUNTER — Encounter: Payer: Self-pay | Admitting: Internal Medicine

## 2017-12-09 ENCOUNTER — Encounter: Payer: Self-pay | Admitting: *Deleted

## 2017-12-14 ENCOUNTER — Ambulatory Visit: Payer: BC Managed Care – PPO | Admitting: Internal Medicine

## 2017-12-14 ENCOUNTER — Encounter: Payer: Self-pay | Admitting: Internal Medicine

## 2017-12-14 VITALS — BP 94/60 | HR 68 | Ht 64.57 in | Wt 138.2 lb

## 2017-12-14 DIAGNOSIS — R1013 Epigastric pain: Secondary | ICD-10-CM | POA: Diagnosis not present

## 2017-12-14 DIAGNOSIS — K219 Gastro-esophageal reflux disease without esophagitis: Secondary | ICD-10-CM | POA: Diagnosis not present

## 2017-12-14 MED ORDER — SUCRALFATE 1 G PO TABS: ORAL_TABLET | ORAL | 2 refills | Status: DC

## 2017-12-14 NOTE — Progress Notes (Signed)
Patient ID: Laura Brock, female   DOB: 06/14/1960, 57 y.o.   MRN: [id number] HPI: Laura Brock is a 57 year old female with a past medical history of gluten and dairy sensitivity, migraines, hypothyroidism, history of internal hemorrhoids, who is seen in consultation at the request of Laura Brock to evaluate epigastric abdominal pain.  She is here today with her husband.  She has a GI history with Laura Brock in [facility], last seen 1 year ago and before this in 2014 with Laura Brock.  She has a family history of colon polyps in her father and sister.  She has had 2 previous colonoscopies, last in 2017 which have been normal with the exception of internal hemorrhoids.    Today she is seen to evaluate intermittent epigastric abdominal pain.  This was a similar complaint last year when Laura Brock evaluated her.  At that time it was supposed that she had NSAID related gastritis versus ulcer disease and also an element of GERD.  She was treated with Prilosec 20 mg daily.  MiraLAX or Colace was recommended as needed for constipation.  Ultrasound of the abdomen and EGD were considered if symptoms persisted.  Today she reports that on and off over the last year she has had issues with epigastric abdominal pain.  This can be a sharp but also burning type pain.  She noticed it one year ago when on vacation in [place].  It occurred again this year in 2019 while traveling in [place].  She has hypothesized that this was related to drinking alcohol on consecutive days over 3 to 4-day period.  Usually she rarely drinks alcohol and drinks alcohol more socially but not multiple days in a row.  The pain seems to be worse with spicy foods.  She did take Prilosec 20 mg as well as Carafate for 3 to 4 days and the pain has improved.  She is now been off of omeprazole for 2 weeks and her epigastric pain has resolved at this point.  She still feels as if her epigastrium is "vulnerable" though the pain has improved.  She denies  having heartburn or any chest symptoms though she did notice several days of globus sensation recently after stopping Prilosec.  Today the globus sensation has improved.  She is on no acid suppression medication at present.  In the past Nexium worsened her migraine headaches.  Pantoprazole was prescribed when seen by primary care but she never used it.  She denies early satiety.  No current nausea or vomiting.  Bowel habits are mostly regular though if anything mild constipation.  No blood in her stool or melena.  In the past she had been using ketoprofen for migraines though she is rarely if ever using NSAIDs now.  She has metoclopramide which she rarely uses for migraine induced nausea.  For migraines she has prescription for Relpax to be used when needed.  She sees Laura Brock at [facility].  He is recommended DGL to help soothe stomach when necessary and she has used this on occasion with benefit.  Past Medical History:  Diagnosis Date  . Allergy   . Anxiety   . Arthritis   . History of anal fissures   . Hypothyroidism 07/2010  . Internal hemorrhoids   . Migraine   . Non-celiac gluten sensitivity   . Palpitation     Past Surgical History:  Procedure Laterality Date  . WISDOM TOOTH EXTRACTION      Outpatient Medications Prior to Visit  Medication Sig Dispense Refill  . Cholecalciferol (VITAMIN D3) 5000 UNITS CAPS Take 2 capsules by mouth daily.    . clonazePAM (KLONOPIN) 0.5 MG tablet Take 0.5 tablets (0.25 mg total) by mouth 2 (two) times daily as needed for anxiety. 30 tablet 1  . cyclobenzaprine (FLEXERIL) 10 MG tablet Take 10 mg by mouth 3 (three) times daily as needed for muscle spasms.    Marland Kitchen DIGESTIVE ENZYMES PO Take by mouth daily.    Marland Kitchen eletriptan (RELPAX) 40 MG tablet Take 40 mg by mouth as needed for migraine or headache. One tablet by mouth at onset of headache. May repeat in 2 hours if headache persists or recurs.    . magnesium aspartate (MAGINEX) 615  MG tablet Take 615 mg by mouth at bedtime.     . metoCLOPramide (REGLAN) 10 MG tablet Take 1 tablet (10 mg total) by mouth every 6 (six) hours as needed for nausea or vomiting. 30 tablet 1  . Multiple Vitamin (MULTIVITAMIN) tablet Take 1 tablet by mouth daily.    . Omega-3 Fatty Acids (OMEGA-3 FISH OIL PO) Take 820 mg by mouth 2 (two) times daily.    . promethazine (PHENERGAN) 25 MG tablet Take 1 tablet (25 mg total) by mouth every 6 (six) hours as needed for nausea. 12 tablet 0  . thyroid (NATURE-THROID) 32.5 MG tablet Take 32.5 mg by mouth daily.    . Thyroid (NATURE-THROID) 48.75 MG TABS Take 1 tablet by mouth daily.    . Omega-3 Fatty Acids (FISH OIL PO) Take 820 mg by mouth 2 (two) times daily.    Marland Kitchen omeprazole (PRILOSEC OTC) 20 MG tablet Take 20 mg by mouth daily.    . sucralfate (CARAFATE) 1 g tablet Take 1 tablet (1 g total) by mouth 4 (four) times daily -  with meals and at bedtime. 30 tablet 0  . ARMOUR THYROID 15 MG tablet Take 15 mg by mouth every morning.  3  . Ascorbic Acid (VITAMIN C PO) Take 2,000 mg by mouth daily.     . Cimetidine (TAGAMET PO) Take 1 tablet by mouth daily as needed (acid reflux).     Marland Kitchen ketoprofen (ORUDIS) 75 MG capsule Take 75 mg by mouth 4 (four) times daily as needed (migraines, if not relief then pt will take relpax).     . pantoprazole (PROTONIX) 40 MG tablet Take 1 tablet (40 mg total) by mouth daily. 30 tablet 3  . pantoprazole (PROTONIX) 40 MG tablet Take 40 mg by mouth daily.     No facility-administered medications prior to visit.     Allergies  Allergen Reactions  . Latex Rash  . Penicillins Swelling    Has patient had a PCN reaction causing immediate rash, facial/tongue/throat swelling, SOB or lightheadedness with hypotension: Yes Has patient had a PCN reaction causing severe rash involving mucus membranes or skin necrosis: No Has patient had a PCN reaction that required hospitalization Yes- pcp visit Has patient had a PCN reaction occurring  within the last 10 years: no, childhood allergy If all of the above answers are "NO", then may proceed with Cephalosporin use.   Marland Kitchen Zofran [Ondansetron Hcl]     Patient has severe headaches with Zofran.  . Dairy Aid [Lactase]   . Gluten Meal     Family History  Problem Relation Age of Onset  . Thyroid disease Mother   . Colon polyps Father   . Liver cancer Father   . Diabetes Maternal Grandfather   . Heart  disease Paternal Grandfather   . Liver disease Neg Hx   . Colon cancer Neg Hx     Social History   Tobacco Use  . Smoking status: Never Smoker  . Smokeless tobacco: Never Used  Substance Use Topics  . Alcohol use: Yes    Comment: social  . Drug use: No    ROS: As per history of present illness, otherwise negative  BP 94/60 (BP Location: Left Arm, Patient Position: Sitting, Cuff Size: Normal)   Pulse 68   Ht 5' 4.57" (1.64 m) Comment: height measured without shoes  Wt 138 lb 4 oz (62.7 kg)   BMI 23.32 kg/m  Constitutional: Well-developed and well-nourished. No distress. HEENT: Normocephalic and atraumatic. Conjunctivae are normal.  No scleral icterus. Neck: Neck supple. Trachea midline. Cardiovascular: Normal rate, regular rhythm and intact distal pulses. No M/R/G Pulmonary/chest: Effort normal and breath sounds normal. No wheezing, rales or rhonchi. Abdominal: Soft, epigastric tenderness without rebound or guarding, nondistended. Bowel sounds active throughout. There are no masses palpable. No hepatosplenomegaly. Extremities: no clubbing, cyanosis, or edema Neurological: Alert and oriented to person place and time. Skin: Skin is warm and dry.  Psychiatric: Normal mood and affect. Behavior is normal.  RELEVANT LABS AND IMAGING: CBC    Component Value Date/Time   WBC 4.8 01/06/2017 0955   RBC 4.06 01/06/2017 0955   HGB 13.0 01/06/2017 0955   HCT 38.9 01/06/2017 0955   PLT 172.0 01/06/2017 0955   MCV 95.8 01/06/2017 0955   MCV 92.2 02/18/2015 1004   MCH  30.7 05/28/2016 1219   MCHC 33.4 01/06/2017 0955   RDW 13.4 01/06/2017 0955   LYMPHSABS 1.3 01/06/2017 0955   MONOABS 0.5 01/06/2017 0955   EOSABS 0.1 01/06/2017 0955   BASOSABS 0.0 01/06/2017 0955    CMP     Component Value Date/Time   NA 141 10/05/2017 1350   K 4.0 10/05/2017 1350   CL 106 10/05/2017 1350   CO2 30 10/05/2017 1350   GLUCOSE 79 10/05/2017 1350   BUN 15 10/05/2017 1350   CREATININE 0.76 10/05/2017 1350   CALCIUM 9.1 10/05/2017 1350   PROT 7.0 10/05/2017 1350   ALBUMIN 4.5 10/05/2017 1350   AST 20 10/05/2017 1350   ALT 18 10/05/2017 1350   ALKPHOS 50 10/05/2017 1350   BILITOT 0.5 10/05/2017 1350   GFRNONAA >60 05/28/2016 1219   GFRAA >60 05/28/2016 1219    ASSESSMENT/PLAN: 57 year old female with a past medical history of gluten and dairy sensitivity, migraines, hypothyroidism, history of internal hemorrhoids, who is seen in consultation at the request of Laura Brock to evaluate epigastric abdominal pain.  1.  Epigastric pain --most consistent with gastritis and in her case possibly alcohol induced.  We discussed this at length today.  Her symptoms do respond to PPI and sucralfate therapy though I am not convinced that she needs this on an ongoing basis.  I have recommended that we perform upper endoscopy given persistence of the symptoms over the last year.  Rule out H. pylori, rule out chronic or autoimmune gastritis.  I recommended that she avoid consecutive days of alcohol use and should this occur I would recommend that she use Carafate 1 g 3 times daily before meals and at bedtime on days when she is drinking alcohol and for several days thereafter to try to prevent alcohol-related gastritis.  Finally I am going to do an abdominal ultrasound to exclude gallbladder disease.  2.  Question GERD/globus --globus sensation may have come from  rebound acid production when coming off omeprazole.  For now she is not having globus sensation or heartburn symptoms.  We are  performing upper endoscopy as discussed above.  We did discuss the risk, benefits and alternatives to upper endoscopy and she is agreeable and wishes to proceed  3.  Family history of colon polyps --repeat colonoscopy recommended based on family history in 2022  [id number], Laura Millet, Md [address] [place], [place] [address]

## 2017-12-14 NOTE — Patient Instructions (Addendum)
You have been scheduled for an endoscopy. Please follow written instructions given to you at your visit today. If you use inhalers (even only as needed), please bring them with you on the day of your procedure. Your physician has requested that you go to www.startemmi.com and enter the access code given to you at your visit today. This web site gives a general overview about your procedure. However, you should still follow specific instructions given to you by our office regarding your preparation for the procedure. _______________________________________________________________________ We have sent the following medications to your pharmacy for you to pick up at your convenience: Carafate 1 gram before meals and at bedtime as needed ______________________________________________________________________ Dennis Bast may continue to take your DGL licorice.  _______________________________________________________________________ Dennis Bast have been scheduled for an abdominal ultrasound at [facility] (1st floor of hospital) on Monday, 12/20/17 at 9:00 am. Please arrive 15 minutes prior to your appointment for registration. Make certain not to have anything to eat or drink 6 hours prior to your appointment. Should you need to reschedule your appointment, please contact radiology at [phone]. This test typically takes about 30 minutes to perform. ______________________________________________________________________ If you are age 42 or older, your body mass index should be between 23-30. Your Body mass index is 23.32 kg/m. If this is out of the aforementioned range listed, please consider follow up with your Primary Care Provider.  If you are age 20 or younger, your body mass index should be between 19-25. Your Body mass index is 23.32 kg/m. If this is out of the aformentioned range listed, please consider follow up with your Primary Care Provider.

## 2017-12-15 ENCOUNTER — Encounter: Payer: Self-pay | Admitting: Internal Medicine

## 2017-12-20 ENCOUNTER — Ambulatory Visit (HOSPITAL_COMMUNITY)
Admission: RE | Admit: 2017-12-20 | Discharge: 2017-12-20 | Disposition: A | Discharge status: Home / Self Care | Payer: BC Managed Care – PPO | Source: Ambulatory Visit | Attending: Internal Medicine | Admitting: Internal Medicine

## 2017-12-20 DIAGNOSIS — R1013 Epigastric pain: Secondary | ICD-10-CM | POA: Diagnosis not present

## 2017-12-22 ENCOUNTER — Encounter

## 2017-12-24 ENCOUNTER — Telehealth: Payer: Self-pay | Admitting: General Practice

## 2017-12-24 DIAGNOSIS — M8580 Other specified disorders of bone density and structure, unspecified site: Secondary | ICD-10-CM

## 2017-12-24 NOTE — Addendum Note (Signed)
Addended by: Davis Gourd on: 12/24/2017 02:37 PM   Modules accepted: Orders

## 2017-12-24 NOTE — Telephone Encounter (Signed)
Ok for DEXA (dx osteopenia)

## 2017-12-24 NOTE — Telephone Encounter (Signed)
Dexa was ordered will advise pt.

## 2017-12-24 NOTE — Telephone Encounter (Signed)
Ravinia for Dexa orders? Last was 11/07/15   Copied from [place] [id number]. Topic: General - Other >> Dec 24, 2017 12:32 PM Selinda Flavin B, NT wrote: Reason for CRM: Patient calling and states that she would like to get her dexa bone density scan done. States that it has been 2 years. Would like to get the done before her fiscal year is up on her insurance. CB#:[phone]

## 2017-12-29 ENCOUNTER — Encounter: Payer: Self-pay | Admitting: Internal Medicine

## 2017-12-29 ENCOUNTER — Ambulatory Visit (AMBULATORY_SURGERY_CENTER): Payer: BC Managed Care – PPO | Admitting: Internal Medicine

## 2017-12-29 VITALS — BP 93/52 | HR 59 | Temp 98.7°F | Resp 12 | Ht 64.0 in | Wt 138.0 lb

## 2017-12-29 DIAGNOSIS — K3189 Other diseases of stomach and duodenum: Secondary | ICD-10-CM | POA: Diagnosis not present

## 2017-12-29 DIAGNOSIS — K2990 Gastroduodenitis, unspecified, without bleeding: Secondary | ICD-10-CM | POA: Diagnosis not present

## 2017-12-29 DIAGNOSIS — R1013 Epigastric pain: Secondary | ICD-10-CM

## 2017-12-29 DIAGNOSIS — K2970 Gastritis, unspecified, without bleeding: Secondary | ICD-10-CM | POA: Diagnosis not present

## 2017-12-29 MED ORDER — SODIUM CHLORIDE 0.9 % IV SOLN: 500.0000 mL | Freq: Once | INTRAVENOUS | Status: DC

## 2017-12-29 NOTE — Progress Notes (Signed)
Called to room to assist during endoscopic procedure.  Patient ID and intended procedure confirmed with present staff. Received instructions for my participation in the procedure from the performing physician.  

## 2017-12-29 NOTE — Progress Notes (Signed)
No problems noted in the recovery room. maw 

## 2017-12-29 NOTE — Op Note (Signed)
[facility] Patient Name: Laura Brock Procedure Date: 12/29/2017 10:34 AM MRN: [id number] Endoscopist: Jerene Bears , MD Age: 57 Referring MD:  Date of Birth: 04-May-1960 Gender: Female Account #: [id number] Procedure:                Upper GI endoscopy Indications:              Epigastric abdominal pain Medicines:                Monitored Anesthesia Care Procedure:                Pre-Anesthesia Assessment:                           - Prior to the procedure, a History and Physical                            was performed, and patient medications and                            allergies were reviewed. The patient's tolerance of                            previous anesthesia was also reviewed. The risks                            and benefits of the procedure and the sedation                            options and risks were discussed with the patient.                            All questions were answered, and informed consent                            was obtained. Prior Anticoagulants: The patient has                            taken no previous anticoagulant or antiplatelet                            agents. ASA Grade Assessment: II - A patient with                            mild systemic disease. After reviewing the risks                            and benefits, the patient was deemed in                            satisfactory condition to undergo the procedure.                           After obtaining informed consent, the endoscope was  passed under direct vision. Throughout the                            procedure, the patient's blood pressure, pulse, and                            oxygen saturations were monitored continuously. The                            Endoscope was introduced through the mouth, and                            advanced to the second part of duodenum. The upper                            GI endoscopy was accomplished  without difficulty.                            The patient tolerated the procedure well. Scope In: Scope Out: Findings:                 The examined esophagus was normal.                           Localized mild inflammation characterized by an                            erosion and erythema was found in the gastric                            antrum and in the prepyloric region of the stomach.                            Biopsies were taken with a cold forceps for                            histology and Helicobacter pylori testing (body,                            antrum, incisura).                           The cardia and gastric fundus were normal on                            retroflexion.                           Localized mild inflammation characterized by                            erythema was found in the duodenal bulb.                           The second portion of the duodenum was normal. Complications:  No immediate complications. Estimated Blood Loss:     Estimated blood loss was minimal. Impression:               - Normal esophagus.                           - Mild gastritis. Biopsied.                           - Mild bulbar duodenitis.                           - Normal second portion of the duodenum. Recommendation:           - Patient has a contact number available for                            emergencies. The signs and symptoms of potential                            delayed complications were discussed with the                            patient. Return to normal activities tomorrow.                            Written discharge instructions were provided to the                            patient.                           - Resume previous diet.                           - Continue present medications.                           - Await pathology results. Jerene Bears, MD 12/29/2017 10:49:56 AM This report has been signed electronically.

## 2017-12-29 NOTE — Patient Instructions (Signed)
YOU HAD AN ENDOSCOPIC PROCEDURE TODAY AT THE [facility]:   Refer to the procedure report that was given to you for any specific questions about what was found during the examination.  If the procedure report does not answer your questions, please call your gastroenterologist to clarify.  If you requested that your care partner not be given the details of your procedure findings, then the procedure report has been included in a sealed envelope for you to review at your convenience later.  YOU SHOULD EXPECT: Some feelings of bloating in the abdomen. Passage of more gas than usual.  Walking can help get rid of the air that was put into your GI tract during the procedure and reduce the bloating. If you had a lower endoscopy (such as a colonoscopy or flexible sigmoidoscopy) you may notice spotting of blood in your stool or on the toilet paper. If you underwent a bowel prep for your procedure, you may not have a normal bowel movement for a few days.  Please Note:  You might notice some irritation and congestion in your nose or some drainage.  This is from the oxygen used during your procedure.  There is no need for concern and it should clear up in a day or so.  SYMPTOMS TO REPORT IMMEDIATELY:   Following upper endoscopy (EGD)  Vomiting of blood or coffee ground material  New chest pain or pain under the shoulder blades  Painful or persistently difficult swallowing  New shortness of breath  Fever of 100F or higher  Black, tarry-looking stools  For urgent or emergent issues, a gastroenterologist can be reached at any hour by calling [phone].   DIET:  We do recommend a small meal at first, but then you may proceed to your regular diet.  Drink plenty of fluids but you should avoid alcoholic beverages for 24 hours.  ACTIVITY:  You should plan to take it easy for the rest of today and you should NOT DRIVE or use heavy machinery until tomorrow (because of the sedation medicines used  during the test).    FOLLOW UP: Our staff will call the number listed on your records the next business day following your procedure to check on you and address any questions or concerns that you may have regarding the information given to you following your procedure. If we do not reach you, we will leave a message.  However, if you are feeling well and you are not experiencing any problems, there is no need to return our call.  We will assume that you have returned to your regular daily activities without incident.  If any biopsies were taken you will be contacted by phone or by letter within the next 1-3 weeks.  Please call us at [phone] if you have not heard about the biopsies in 3 weeks.    SIGNATURES/CONFIDENTIALITY: You and/or your care partner have signed paperwork which will be entered into your electronic medical record.  These signatures attest to the fact that that the information above on your After Visit Summary has been reviewed and is understood.  Full responsibility of the confidentiality of this discharge information lies with you and/or your care-partner.    Handout was given to your care partner on Gastritis. You may resume your current medications today. Await biopsy results. Please call if any questions or concerns.   

## 2017-12-29 NOTE — Progress Notes (Signed)
To recovery, report to RN, VSS. 

## 2017-12-30 ENCOUNTER — Telehealth: Payer: Self-pay

## 2017-12-30 NOTE — Telephone Encounter (Signed)
  Follow up Call-  Call back number 12/29/2017  Post procedure Call Back phone  # [phone]  Permission to leave phone message Yes  Some recent data might be hidden     Patient questions:  Do you have a fever, pain , or abdominal swelling? No. Pain Score  0 *  Have you tolerated food without any problems? Yes.    Have you been able to return to your normal activities? Yes.    Do you have any questions about your discharge instructions: Diet   No. Medications  No. Follow up visit  No.  Do you have questions or concerns about your Care? No.  Actions: * If pain score is 4 or above: No action needed, pain <4.

## 2017-12-31 NOTE — Addendum Note (Signed)
Addended by: Davis Gourd on: 12/31/2017 03:13 PM   Modules accepted: Orders

## 2017-12-31 NOTE — Telephone Encounter (Signed)
Pt states that she has not heard from anyone for bone density testing. She wants to have bone density at [facility] in [place] but order says [facility] [place]. Can order be updated and sent to [facility]? Please notify pt.  Call back # [phone]

## 2017-12-31 NOTE — Telephone Encounter (Signed)
Placed a new order just now for [facility]. Can you send this to them please?

## 2018-01-06 ENCOUNTER — Encounter: Payer: Self-pay | Admitting: Internal Medicine

## 2018-01-07 ENCOUNTER — Encounter: Payer: Self-pay | Admitting: Family Medicine

## 2018-01-07 ENCOUNTER — Other Ambulatory Visit: Payer: Self-pay

## 2018-01-07 ENCOUNTER — Ambulatory Visit (INDEPENDENT_AMBULATORY_CARE_PROVIDER_SITE_OTHER): Payer: BC Managed Care – PPO | Admitting: Family Medicine

## 2018-01-07 VITALS — BP 110/68 | HR 61 | Temp 97.8°F | Resp 14 | Ht 65.5 in | Wt 137.0 lb

## 2018-01-07 DIAGNOSIS — F419 Anxiety disorder, unspecified: Secondary | ICD-10-CM

## 2018-01-07 DIAGNOSIS — Z0000 Encounter for general adult medical examination without abnormal findings: Secondary | ICD-10-CM | POA: Diagnosis not present

## 2018-01-07 DIAGNOSIS — M8580 Other specified disorders of bone density and structure, unspecified site: Secondary | ICD-10-CM

## 2018-01-07 DIAGNOSIS — F329 Major depressive disorder, single episode, unspecified: Secondary | ICD-10-CM | POA: Insufficient documentation

## 2018-01-07 DIAGNOSIS — E038 Other specified hypothyroidism: Secondary | ICD-10-CM | POA: Diagnosis not present

## 2018-01-07 DIAGNOSIS — F32A Depression, unspecified: Secondary | ICD-10-CM | POA: Insufficient documentation

## 2018-01-07 LAB — LIPID PANEL
CHOL/HDL RATIO: 3
Cholesterol: 162 mg/dL (ref 0–200)
HDL: 55.5 mg/dL (ref >39.00)
LDL CALC: 92 mg/dL (ref 0–99)
NONHDL: 106.49
Triglycerides: 71 mg/dL (ref 0.0–149.0)
VLDL: 14.2 mg/dL (ref 0.0–40.0)

## 2018-01-07 LAB — BASIC METABOLIC PANEL
BUN: 23 mg/dL (ref 6–23)
CHLORIDE: 105 meq/L (ref 96–112)
CO2: 30 mEq/L (ref 19–32)
Calcium: 9.6 mg/dL (ref 8.4–10.5)
Creatinine, Ser: 0.75 mg/dL (ref 0.40–1.20)
GFR: 84.25 mL/min (ref >60.00)
Glucose, Bld: 87 mg/dL (ref 70–99)
POTASSIUM: 4.1 meq/L (ref 3.5–5.1)
Sodium: 141 mEq/L (ref 135–145)

## 2018-01-07 LAB — CBC WITH DIFFERENTIAL/PLATELET
BASOS PCT: 0.7 % (ref 0.0–3.0)
Basophils Absolute: 0 10*3/uL (ref 0.0–0.1)
EOS PCT: 2.4 % (ref 0.0–5.0)
Eosinophils Absolute: 0.1 10*3/uL (ref 0.0–0.7)
HCT: 39.7 % (ref 36.0–46.0)
HEMOGLOBIN: 13.3 g/dL (ref 12.0–15.0)
LYMPHS ABS: 1.2 10*3/uL (ref 0.7–4.0)
Lymphocytes Relative: 29.2 % (ref 12.0–46.0)
MCHC: 33.6 g/dL (ref 30.0–36.0)
MCV: 93.3 fl (ref 78.0–100.0)
MONO ABS: 0.5 10*3/uL (ref 0.1–1.0)
Monocytes Relative: 12.3 % — ABNORMAL HIGH (ref 3.0–12.0)
Neutro Abs: 2.3 10*3/uL (ref 1.4–7.7)
Neutrophils Relative %: 55.4 % (ref 43.0–77.0)
Platelets: 187 10*3/uL (ref 150.0–400.0)
RBC: 4.26 Mil/uL (ref 3.87–5.11)
RDW: 12.9 % (ref 11.5–15.5)
WBC: 4.2 10*3/uL (ref 4.0–10.5)

## 2018-01-07 LAB — HEPATIC FUNCTION PANEL
ALK PHOS: 60 U/L (ref 39–117)
ALT: 16 U/L (ref 0–35)
AST: 18 U/L (ref 0–37)
Albumin: 4.6 g/dL (ref 3.5–5.2)
Bilirubin, Direct: 0.1 mg/dL (ref 0.0–0.3)
Total Bilirubin: 0.5 mg/dL (ref 0.2–1.2)
Total Protein: 7.3 g/dL (ref 6.0–8.3)

## 2018-01-07 LAB — T4, FREE: Free T4: 0.6 ng/dL (ref 0.60–1.60)

## 2018-01-07 LAB — VITAMIN D 25 HYDROXY (VIT D DEFICIENCY, FRACTURES): VITD: 68.07 ng/mL (ref 30.00–100.00)

## 2018-01-07 LAB — TSH: TSH: 0.98 u[IU]/mL (ref 0.35–4.50)

## 2018-01-07 LAB — T3, FREE: T3, Free: 3 pg/mL (ref 2.3–4.2)

## 2018-01-07 MED ORDER — CLONAZEPAM 0.5 MG PO TABS: 0.2500 mg | ORAL_TABLET | Freq: Two times a day (BID) | ORAL | 1 refills | Status: DC | PRN

## 2018-01-07 MED ORDER — ELETRIPTAN HYDROBROMIDE 40 MG PO TABS: 40.0000 mg | ORAL_TABLET | Freq: Once | ORAL | 6 refills | Status: DC

## 2018-01-07 MED ORDER — PROMETHAZINE HCL 25 MG PO TABS: 25.0000 mg | ORAL_TABLET | Freq: Four times a day (QID) | ORAL | 1 refills | Status: DC | PRN

## 2018-01-07 NOTE — Progress Notes (Signed)
   Subjective:    Patient ID: Laura Brock, female    DOB: 1960-09-25, 57 y.o.   MRN: [id number]  HPI CPE- UTD on colonoscopy, mammo.  Due for DEXA.  Declines flu shot.  UTD on Tdap.  UTD on pap.   Review of Systems Patient reports no vision/ hearing changes, adenopathy,fever, weight change,  persistant/recurrent hoarseness , swallowing issues, chest pain, palpitations, edema, persistant/recurrent cough, hemoptysis, dyspnea (rest/exertional/paroxysmal nocturnal), gastrointestinal bleeding (melena, rectal bleeding), significant heartburn, bowel changes, GU symptoms (dysuria, hematuria, incontinence), Gyn symptoms (abnormal  bleeding, pain),  syncope, focal weakness, memory loss, numbness & tingling, skin/hair/nail changes, abnormal bruising or bleeding, anxiety, or depression.   + insomnia + epigastric pain    Objective:   Physical Exam General Appearance:    Alert, cooperative, no distress, appears stated age  Head:    Normocephalic, without obvious abnormality, atraumatic  Eyes:    PERRL, conjunctiva/corneas clear, EOM's intact, fundi    benign, both eyes  Ears:    Normal TM's and external ear canals, both ears  Nose:   Nares normal, septum midline, mucosa normal, no drainage    or sinus tenderness  Throat:   Lips, mucosa, and tongue normal; teeth and gums normal  Neck:   Supple, symmetrical, trachea midline, no adenopathy;    Thyroid: no enlargement/tenderness/nodules  Back:     Symmetric, no curvature, ROM normal, no CVA tenderness  Lungs:     Clear to auscultation bilaterally, respirations unlabored  Chest Wall:    No tenderness or deformity   Heart:    Regular rate and rhythm, S1 and S2 normal, no murmur, rub   or gallop  Breast Exam:    Deferred to GYN  Abdomen:     Soft, non-tender, bowel sounds active all four quadrants,    no masses, no organomegaly  Genitalia:    Deferred to GYN  Rectal:    Extremities:   Extremities normal, atraumatic, no cyanosis or edema  Pulses:   2+  and symmetric all extremities  Skin:   Skin color, texture, turgor normal, no rashes or lesions  Lymph nodes:   Cervical, supraclavicular, and axillary nodes normal  Neurologic:   CNII-XII intact, normal strength, sensation and reflexes    throughout          Assessment & Plan:

## 2018-01-07 NOTE — Assessment & Plan Note (Signed)
Pt's PE WNL.  UTD on colonoscopy, mammo, pap.  DEXA ordered.  Check labs.  Anticipatory guidance provided.

## 2018-01-07 NOTE — Assessment & Plan Note (Signed)
Check Vit D.  Replete prn.  DEXA ordered.

## 2018-01-07 NOTE — Assessment & Plan Note (Signed)
Chronic problem.  Currently asymptomatic.  Also seeing [facility].  Check labs.  Adjust meds prn

## 2018-01-07 NOTE — Patient Instructions (Signed)
Follow up in 1 year or as needed We'll notify you of your lab results and make any changes if needed Continue to work on healthy diet and regular exercise- you look great! Start Melatonin for sleep.  Take 1 hr prior to desired bed time Call with any questions or concerns Happy Fall!!!b

## 2018-01-20 LAB — HM DEXA SCAN

## 2018-01-31 ENCOUNTER — Telehealth: Payer: Self-pay | Admitting: Internal Medicine

## 2018-01-31 ENCOUNTER — Encounter: Payer: Self-pay | Admitting: General Practice

## 2018-01-31 NOTE — Telephone Encounter (Signed)
Pt states she is still having epigastric pain and wants to know what the next step is since her results have been normal so far. Please advise.

## 2018-01-31 NOTE — Telephone Encounter (Signed)
Pt wants to know what the next step in her treatment is since labs were fine and she still has sxs.

## 2018-02-03 NOTE — Telephone Encounter (Signed)
Symptoms most consistent with gastritis or gastropathy and have been PPI responsive in the past EGD did show reactive gastropathy but not H. pylori infection Ultrasound was normal including gallbladder appearance  Please ask her if she would be agreeable to taking pantoprazole 40 mg daily for 8 weeks to see if this totally resolves epigastric pain before consideration of cross-sectional imaging such as CT scan If agreeable she should take this daily, 30 minutes before breakfast This would be a trial to see if daily ongoing PPI therapy would resolve symptoms entirely

## 2018-02-04 MED ORDER — PANTOPRAZOLE SODIUM 40 MG PO TBEC: 40.0000 mg | DELAYED_RELEASE_TABLET | Freq: Every day | ORAL | 1 refills | Status: DC

## 2018-02-04 NOTE — Telephone Encounter (Signed)
Spoke with pt and she is agreeable and requests script be sent to [facility].

## 2018-06-12 LAB — HM PAP SMEAR

## 2018-06-12 LAB — HM MAMMOGRAPHY: HM Mammogram: NORMAL (ref 0–4)

## 2018-06-17 ENCOUNTER — Other Ambulatory Visit: Payer: Self-pay | Admitting: Family Medicine

## 2018-06-17 NOTE — Telephone Encounter (Signed)
Last refill:01/07/18 #30, 1 Last OV:01/07/18

## 2018-07-27 ENCOUNTER — Telehealth: Payer: Self-pay

## 2018-07-27 MED ORDER — SUCRALFATE 1 GM/10ML PO SUSP: 1.0000 g | Freq: Three times a day (TID) | ORAL | 0 refills | Status: DC

## 2018-07-27 NOTE — Addendum Note (Signed)
Addended by: Midge Minium on: 07/27/2018 01:09 PM   Modules accepted: Orders

## 2018-07-27 NOTE — Telephone Encounter (Signed)
Prescription for liquid sent to pharmacy.

## 2018-07-27 NOTE — Telephone Encounter (Signed)
Copied from [place] [id number]. Topic: General - Other >> Jul 27, 2018 12:46 PM Wynetta Emery, [place] C wrote: Reason for CRM: pt called in to request the liquid sucralfate (CARAFATE) instead of tablet. Pt says that liquid is just easier for her to take. Pt says that Dr. Birdie Riddle prescribed this medication before Dr Hilarie Fredrickson.   Pharmacy:  [id number] - [place], [place] [phone] (Phone) [phone] (Fax)

## 2018-11-25 ENCOUNTER — Encounter: Payer: Self-pay | Admitting: *Deleted

## 2018-12-02 ENCOUNTER — Ambulatory Visit: Payer: BC Managed Care – PPO | Admitting: Nurse Practitioner

## 2018-12-30 ENCOUNTER — Ambulatory Visit: Payer: BC Managed Care – PPO | Admitting: Gastroenterology

## 2019-01-07 ENCOUNTER — Other Ambulatory Visit: Payer: Self-pay | Admitting: Family Medicine

## 2019-01-12 ENCOUNTER — Ambulatory Visit (INDEPENDENT_AMBULATORY_CARE_PROVIDER_SITE_OTHER): Payer: BC Managed Care – PPO | Admitting: Family Medicine

## 2019-01-12 ENCOUNTER — Other Ambulatory Visit: Payer: Self-pay

## 2019-01-12 ENCOUNTER — Encounter: Payer: Self-pay | Admitting: Family Medicine

## 2019-01-12 VITALS — BP 112/60 | HR 79 | Temp 97.9°F | Resp 16 | Ht 66.0 in | Wt 133.5 lb

## 2019-01-12 DIAGNOSIS — M8580 Other specified disorders of bone density and structure, unspecified site: Secondary | ICD-10-CM

## 2019-01-12 DIAGNOSIS — Z0000 Encounter for general adult medical examination without abnormal findings: Secondary | ICD-10-CM

## 2019-01-12 DIAGNOSIS — E038 Other specified hypothyroidism: Secondary | ICD-10-CM

## 2019-01-12 LAB — CBC WITH DIFFERENTIAL/PLATELET
Basophils Absolute: 0 10*3/uL (ref 0.0–0.1)
Basophils Relative: 0.8 % (ref 0.0–3.0)
Eosinophils Absolute: 0.1 10*3/uL (ref 0.0–0.7)
Eosinophils Relative: 2 % (ref 0.0–5.0)
HCT: 38.2 % (ref 36.0–46.0)
Hemoglobin: 13 g/dL (ref 12.0–15.0)
Lymphocytes Relative: 31.9 % (ref 12.0–46.0)
Lymphs Abs: 1.1 10*3/uL (ref 0.7–4.0)
MCHC: 33.9 g/dL (ref 30.0–36.0)
MCV: 93.4 fl (ref 78.0–100.0)
Monocytes Absolute: 0.4 10*3/uL (ref 0.1–1.0)
Monocytes Relative: 12.6 % — ABNORMAL HIGH (ref 3.0–12.0)
Neutro Abs: 1.9 10*3/uL (ref 1.4–7.7)
Neutrophils Relative %: 52.7 % (ref 43.0–77.0)
Platelets: 170 10*3/uL (ref 150.0–400.0)
RBC: 4.09 Mil/uL (ref 3.87–5.11)
RDW: 12.6 % (ref 11.5–15.5)
WBC: 3.6 10*3/uL — ABNORMAL LOW (ref 4.0–10.5)

## 2019-01-12 LAB — BASIC METABOLIC PANEL
BUN: 19 mg/dL (ref 6–23)
CO2: 26 mEq/L (ref 19–32)
Calcium: 9.8 mg/dL (ref 8.4–10.5)
Chloride: 103 mEq/L (ref 96–112)
Creatinine, Ser: 0.81 mg/dL (ref 0.40–1.20)
GFR: 72.28 mL/min (ref >60.00)
Glucose, Bld: 97 mg/dL (ref 70–99)
Potassium: 4 mEq/L (ref 3.5–5.1)
Sodium: 137 mEq/L (ref 135–145)

## 2019-01-12 LAB — HEPATIC FUNCTION PANEL
ALT: 21 U/L (ref 0–35)
AST: 22 U/L (ref 0–37)
Albumin: 4.6 g/dL (ref 3.5–5.2)
Alkaline Phosphatase: 54 U/L (ref 39–117)
Bilirubin, Direct: 0.1 mg/dL (ref 0.0–0.3)
Total Bilirubin: 0.7 mg/dL (ref 0.2–1.2)
Total Protein: 7 g/dL (ref 6.0–8.3)

## 2019-01-12 LAB — TSH: TSH: 3.64 u[IU]/mL (ref 0.35–4.50)

## 2019-01-12 LAB — VITAMIN D 25 HYDROXY (VIT D DEFICIENCY, FRACTURES): VITD: 82.61 ng/mL (ref 30.00–100.00)

## 2019-01-12 LAB — LIPID PANEL
Cholesterol: 174 mg/dL (ref 0–200)
HDL: 54.7 mg/dL (ref >39.00)
LDL Cholesterol: 107 mg/dL — ABNORMAL HIGH (ref 0–99)
NonHDL: 119.76
Total CHOL/HDL Ratio: 3
Triglycerides: 62 mg/dL (ref 0.0–149.0)
VLDL: 12.4 mg/dL (ref 0.0–40.0)

## 2019-01-12 MED ORDER — CYCLOBENZAPRINE HCL 10 MG PO TABS: 10.0000 mg | ORAL_TABLET | Freq: Three times a day (TID) | ORAL | 3 refills | Status: DC | PRN

## 2019-01-12 MED ORDER — PROMETHAZINE HCL 25 MG PO TABS: 25.0000 mg | ORAL_TABLET | Freq: Four times a day (QID) | ORAL | 1 refills | Status: DC | PRN

## 2019-01-12 MED ORDER — CLONAZEPAM 0.5 MG PO TABS: 0.2500 mg | ORAL_TABLET | Freq: Two times a day (BID) | ORAL | 1 refills | Status: DC | PRN

## 2019-01-12 MED ORDER — ELETRIPTAN HYDROBROMIDE 40 MG PO TABS: 40.0000 mg | ORAL_TABLET | Freq: Once | ORAL | 6 refills | Status: DC

## 2019-01-12 NOTE — Assessment & Plan Note (Signed)
UTD on DEXA.  Check Vit D and replete prn. 

## 2019-01-12 NOTE — Assessment & Plan Note (Signed)
Chronic problem.  Managed by Endo.  Check labs to risk stratify.  Will follow along.

## 2019-01-12 NOTE — Assessment & Plan Note (Signed)
Pt's PE WNL.  UTD on pap, mammo, colonoscopy, Tdap.  Declines flu.  Check labs.  Anticipatory guidance provided.  

## 2019-01-12 NOTE — Progress Notes (Signed)
   Subjective:    Patient ID: Laura Brock, female    DOB: [birth year], 59 y.o.   MRN: [id number]  HPI CPE- UTD on pap, mammo, colonoscopy, Tdap.  Declines flu shot.  No concerns today.  Pt is exercising regularly.   Review of Systems Patient reports no vision/ hearing changes, adenopathy,fever, weight change,  persistant/recurrent hoarseness , swallowing issues, chest pain, palpitations, edema, persistant/recurrent cough, hemoptysis, dyspnea (rest/exertional/paroxysmal nocturnal), gastrointestinal bleeding (melena, rectal bleeding), abdominal pain, significant heartburn, bowel changes, GU symptoms (dysuria, hematuria, incontinence), Gyn symptoms (abnormal  bleeding, pain),  syncope, focal weakness, memory loss, numbness & tingling, skin/hair/nail changes, abnormal bruising or bleeding, anxiety, or depression.     Objective:   Physical Exam General Appearance:    Alert, cooperative, no distress, appears stated age  Head:    Normocephalic, without obvious abnormality, atraumatic  Eyes:    PERRL, conjunctiva/corneas clear, EOM's intact, fundi    benign, both eyes  Ears:    Normal TM's and external ear canals, both ears  Nose:   Deferred due to COVID  Throat:   Neck:   Supple, symmetrical, trachea midline, no adenopathy;    Thyroid: no enlargement/tenderness/nodules  Back:     Symmetric, no curvature, ROM normal, no CVA tenderness  Lungs:     Clear to auscultation bilaterally, respirations unlabored  Chest Wall:    No tenderness or deformity   Heart:    Regular rate and rhythm, S1 and S2 normal, no murmur, rub   or gallop  Breast Exam:    Deferred to GYN  Abdomen:     Soft, non-tender, bowel sounds active all four quadrants,    no masses, no organomegaly  Genitalia:    Deferred to GYN  Rectal:    Extremities:   Extremities normal, atraumatic, no cyanosis or edema  Pulses:   2+ and symmetric all extremities  Skin:   Skin color, texture, turgor normal, no rashes or lesions  Lymph nodes:    Cervical, supraclavicular, and axillary nodes normal  Neurologic:   CNII-XII intact, normal strength, sensation and reflexes    throughout          Assessment & Plan:

## 2019-01-12 NOTE — Patient Instructions (Signed)
Follow up in 1 year or as needed We'll notify you of your lab results and make any changes if needed Keep up the good work!  You look great! Call with any questions or concerns Stay Safe!  Stay Healthy!!! 

## 2019-07-07 ENCOUNTER — Telehealth (HOSPITAL_COMMUNITY): Payer: Self-pay | Admitting: Nurse Practitioner

## 2019-07-07 DIAGNOSIS — U071 COVID-19: Secondary | ICD-10-CM | POA: Insufficient documentation

## 2019-07-07 MED ORDER — BENZONATATE 200 MG PO CAPS: 200.0000 mg | ORAL_CAPSULE | Freq: Three times a day (TID) | ORAL | 0 refills | Status: DC | PRN

## 2019-07-07 NOTE — Telephone Encounter (Signed)
Called to discuss with Laura Brock about Covid symptoms and the use of bamlanivimab, a monoclonal antibody infusion for those with mild to moderate Covid symptoms and at a high risk of hospitalization.     Pt is not qualified for this infusion due to lack of identified risk factors and co-morbid conditions.  Symptoms reviewed as well as criteria for ending isolation.  Symptoms reviewed that would warrant ED/Hospital evaluation as well should her condition worsen. Preventative practices reviewed. Patient verbalized understanding.   Prescription sent for tessalon for cough. Discussed otc medications as well. Discussed vaccination recommendations. Discussed testing recommendation for patient's husband.    Patient Active Problem List   Diagnosis Date Noted  . Anxiety and depression 01/07/2018  . Motion sickness 07/13/2016  . Physical exam 01/03/2015  . 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 Hypothyroidism 04/25/2008  . Osteopenia 04/25/2008  . HEADACHE 04/25/2008    Beckey Rutter, DNP, AGNP-C [facility]  [email or web address] [phone] ([place])

## 2019-07-16 IMAGING — US US ABDOMEN COMPLETE
1 series · 14 of 25 positions shown · non-contrast
Comparison: None

CLINICAL DATA: Epigastric abdominal pain question cholelithiasis

EXAM:
ABDOMEN ULTRASOUND COMPLETE

[Series 1: us abdomen complete · 14 of 102 slices shown]
[im 1/102]
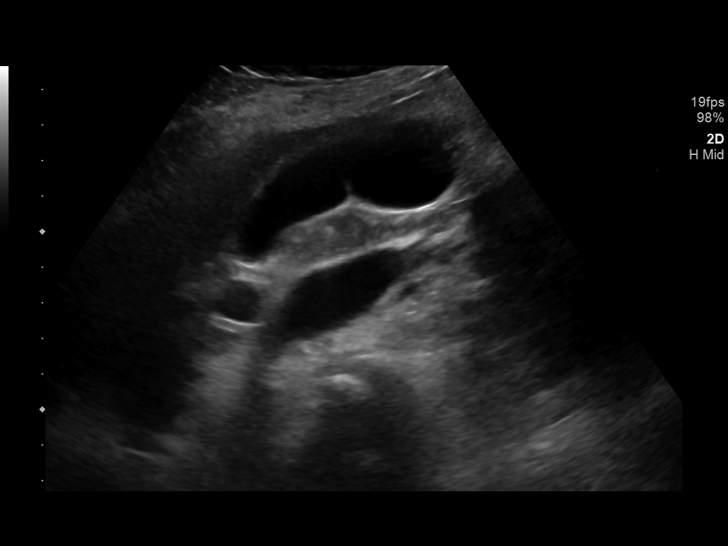
[im 9/102]
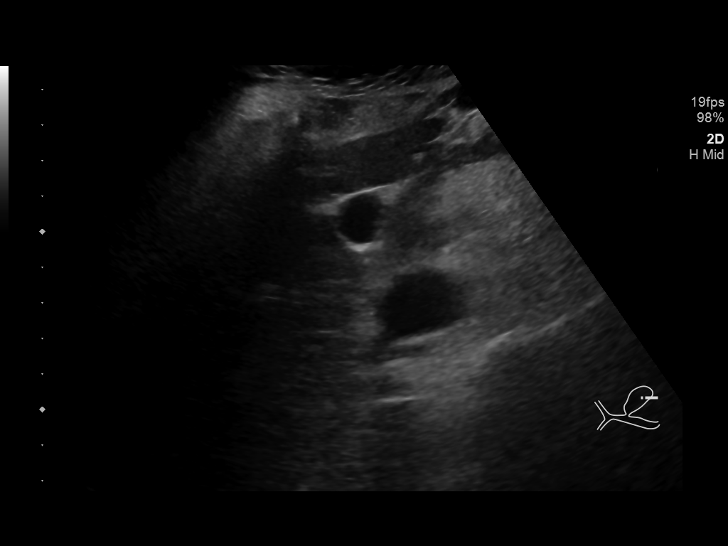
[im 17/102]
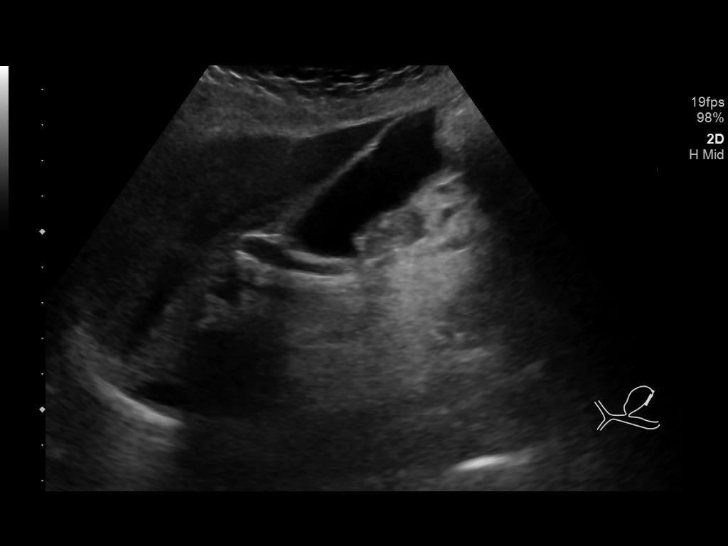
[im 26/102]
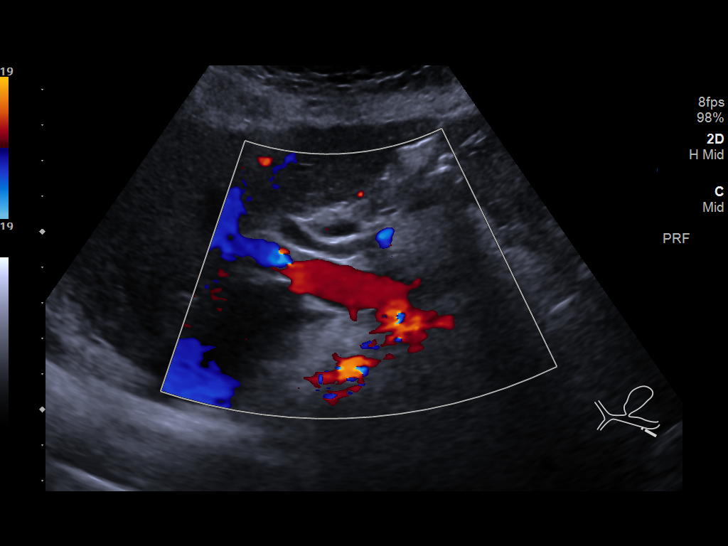
[im 34/102]
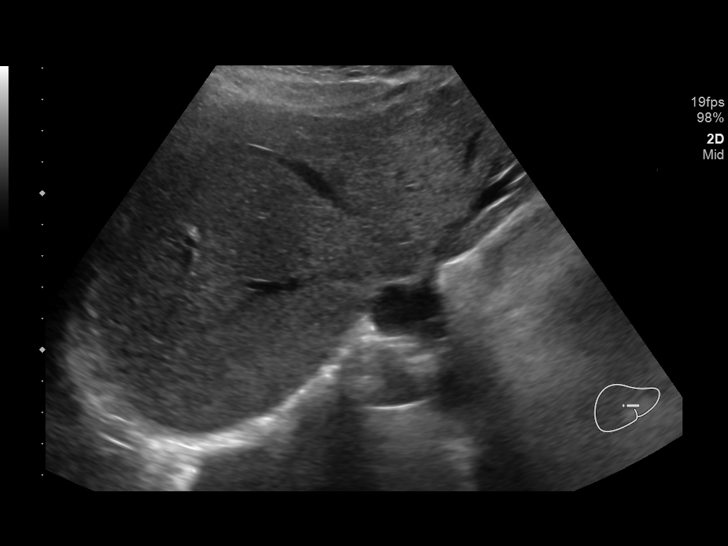
[im 38/102]
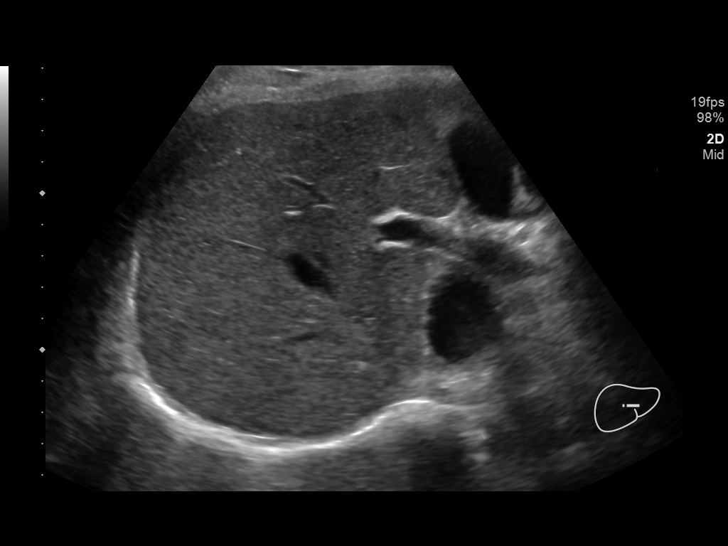
[im 47/102]
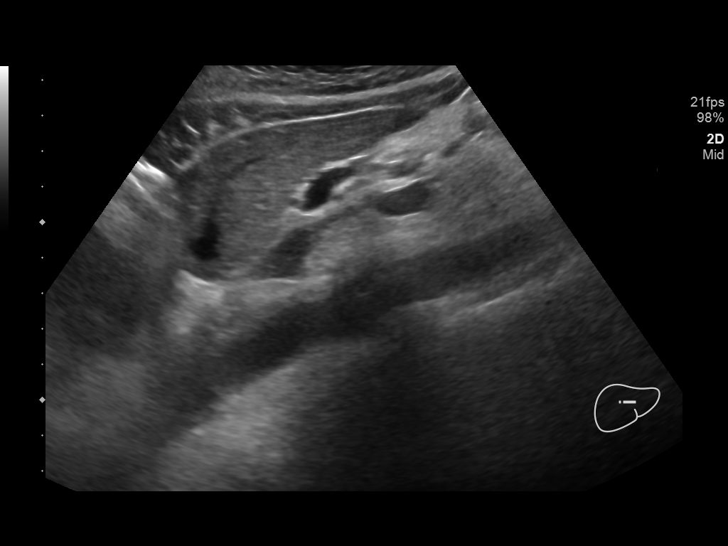
[im 55/102]
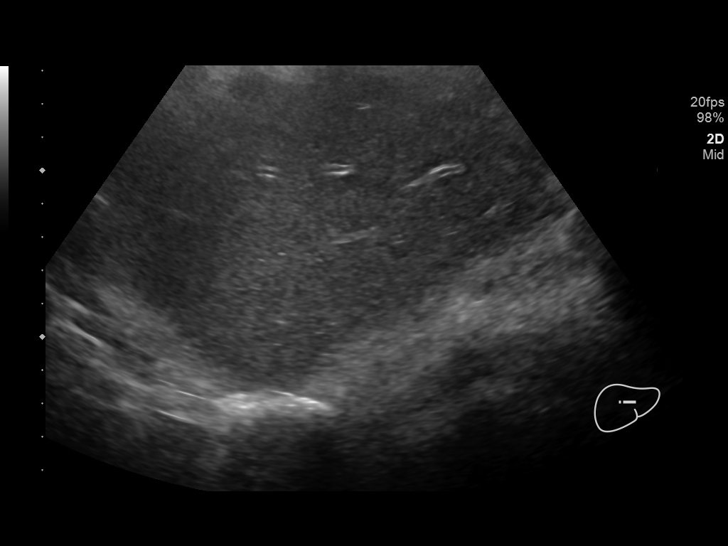
[im 64/102]
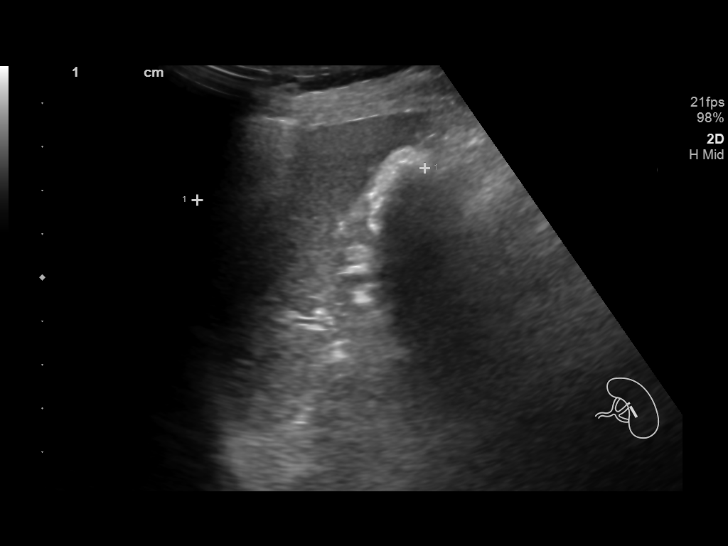
[im 68/102]
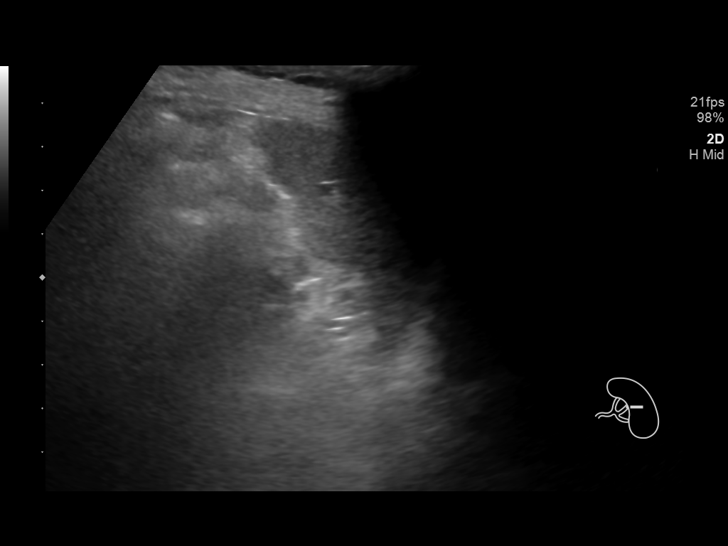
[im 76/102]
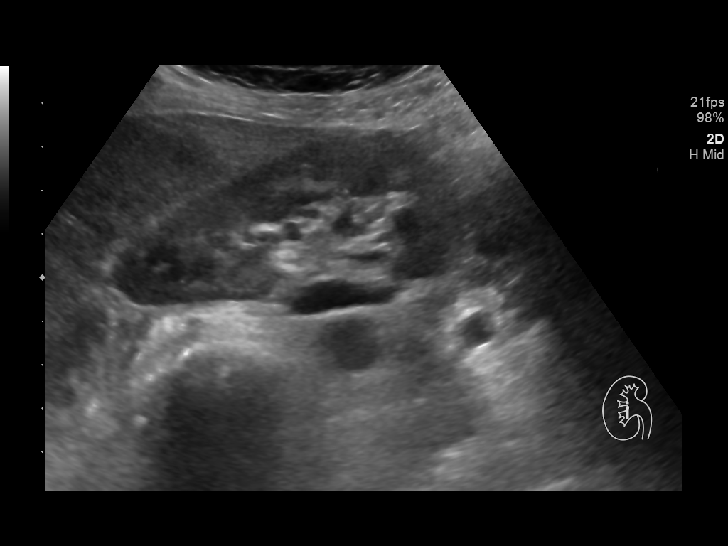
[im 85/102]
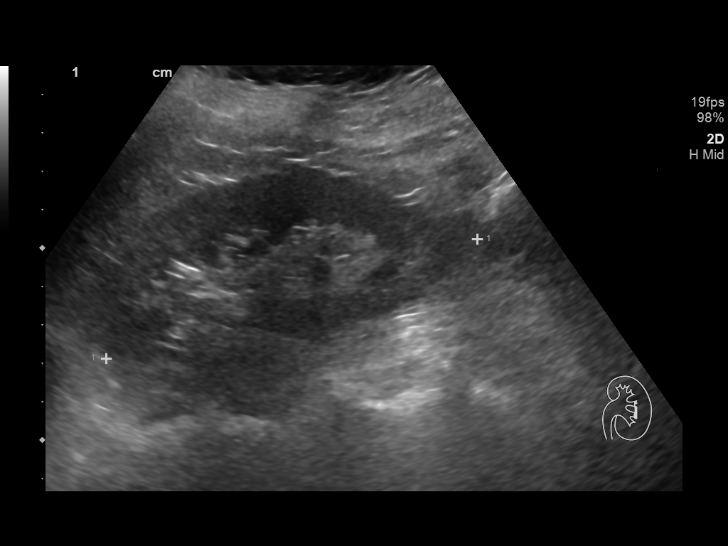
[im 93/102]
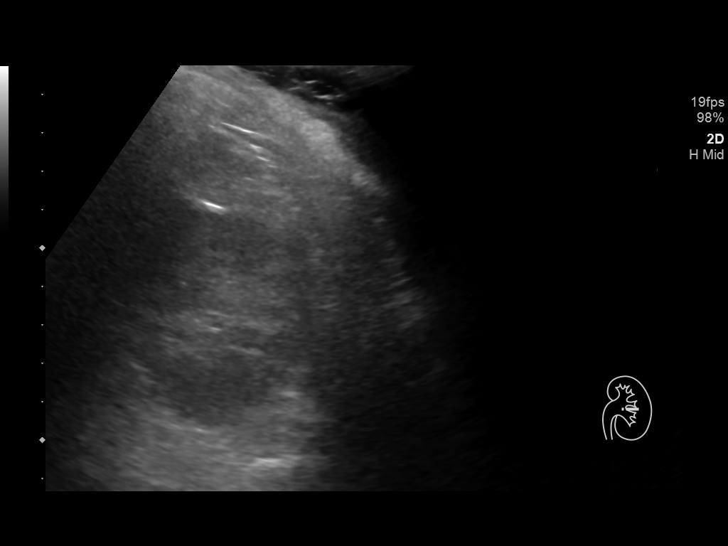
[im 102/102]
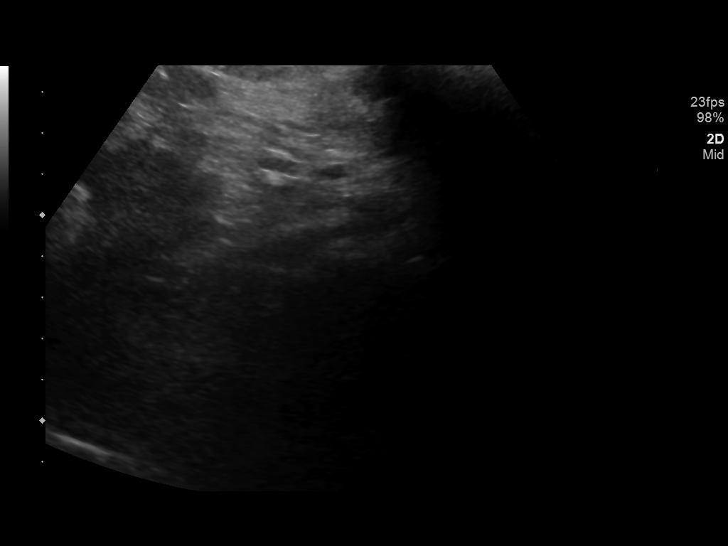

[14 of 25 positions shown; findings below may reference images not displayed]

FINDINGS: Gallbladder: Normally distended without stones or wall thickening.
No pericholecystic fluid or sonographic Murphy sign.

Common bile duct: Diameter: Normal caliber 5 mm diameter

Liver: Normal echogenicity without mass or nodularity. Portal vein
is patent on color Doppler imaging with normal direction of blood
flow towards the liver.

IVC: Normal appearance

Pancreas: Normal appearance

Spleen: Visualized portion normal appearance, with portions of the
cranial aspect of the spleen obscured by gas at the lung bases due
to high subcostal position.

Right Kidney: Length: 10.2 cm. Normal morphology without mass or
hydronephrosis.

Left Kidney: Length: 10.2 cm. Normal morphology without mass or
hydronephrosis.

Abdominal aorta: Normal caliber

Other findings: No free fluid
IMPRESSION: Incomplete visualization of the cranial aspects of the spleen.

Otherwise normal exam.

## 2019-08-07 ENCOUNTER — Encounter: Payer: Self-pay | Admitting: Family Medicine

## 2019-08-29 DIAGNOSIS — M25562 Pain in left knee: Secondary | ICD-10-CM | POA: Insufficient documentation

## 2019-11-25 ENCOUNTER — Other Ambulatory Visit: Payer: Self-pay | Admitting: Oncology

## 2019-11-25 ENCOUNTER — Encounter: Payer: Self-pay | Admitting: Oncology

## 2019-11-25 DIAGNOSIS — U071 COVID-19: Secondary | ICD-10-CM

## 2019-11-25 NOTE — Progress Notes (Signed)
I connected by phone with  Mrs. Able to discuss the potential use of an new treatment for mild to moderate COVID-19 viral infection in non-hospitalized patients.   This patient is a age/sex that meets the FDA criteria for Emergency Use Authorization of casirivimab\imdevimab.  Has a (+) direct SARS-CoV-2 viral test result 1. Has mild or moderate COVID-19  2. Is ? 60 years of age and weighs ? 40 kg 3. Is NOT hospitalized due to COVID-19 4. Is NOT requiring oxygen therapy or requiring an increase in baseline oxygen flow rate due to COVID-19 5. Is within 10 days of symptom onset 6. Has at least one of the high risk factor(s) for progression to severe COVID-19 and/or hospitalization as defined in EUA. ? Specific high risk criteria : Celiac disease    Symptom onset: 11/17/19   I have spoken and communicated the following to the patient or parent/caregiver:   1. FDA has authorized the emergency use of casirivimab\imdevimab for the treatment of mild to moderate COVID-19 in adults and pediatric patients with positive results of direct SARS-CoV-2 viral testing who are 21 years of age and older weighing at least 40 kg, and who are at high risk for progressing to severe COVID-19 and/or hospitalization.   2. The significant known and potential risks and benefits of casirivimab\imdevimab, and the extent to which such potential risks and benefits are unknown.   3. Information on available alternative treatments and the risks and benefits of those alternatives, including clinical trials.   4. Patients treated with casirivimab\imdevimab should continue to self-isolate and use infection control measures (e.g., wear mask, isolate, social distance, avoid sharing personal items, clean and disinfect "high touch" surfaces, and frequent handwashing) according to CDC guidelines.    5. The patient or parent/caregiver has the option to accept or refuse casirivimab\imdevimab .   After reviewing this information with  the patient, The patient agreed to proceed with receiving casirivimab\imdevimab infusion and will be provided a copy of the Fact sheet prior to receiving the infusion.Rulon Abide, AGNP-C [phone] ([place])

## 2019-11-26 ENCOUNTER — Ambulatory Visit (HOSPITAL_COMMUNITY): Payer: BC Managed Care – PPO

## 2020-01-16 ENCOUNTER — Encounter: Payer: Self-pay | Admitting: General Practice

## 2020-01-16 ENCOUNTER — Ambulatory Visit (INDEPENDENT_AMBULATORY_CARE_PROVIDER_SITE_OTHER): Payer: BC Managed Care – PPO | Admitting: Family Medicine

## 2020-01-16 ENCOUNTER — Encounter: Payer: Self-pay | Admitting: Family Medicine

## 2020-01-16 ENCOUNTER — Other Ambulatory Visit: Payer: Self-pay

## 2020-01-16 VITALS — BP 122/80 | HR 57 | Temp 98.3°F | Resp 16 | Ht 65.0 in | Wt 139.2 lb

## 2020-01-16 DIAGNOSIS — Z9109 Other allergy status, other than to drugs and biological substances: Secondary | ICD-10-CM | POA: Insufficient documentation

## 2020-01-16 DIAGNOSIS — M8580 Other specified disorders of bone density and structure, unspecified site: Secondary | ICD-10-CM

## 2020-01-16 DIAGNOSIS — E038 Other specified hypothyroidism: Secondary | ICD-10-CM

## 2020-01-16 DIAGNOSIS — Z0000 Encounter for general adult medical examination without abnormal findings: Secondary | ICD-10-CM

## 2020-01-16 LAB — BASIC METABOLIC PANEL
BUN: 20 mg/dL (ref 6–23)
CO2: 26 mEq/L (ref 19–32)
Calcium: 9.4 mg/dL (ref 8.4–10.5)
Chloride: 104 mEq/L (ref 96–112)
Creatinine, Ser: 0.74 mg/dL (ref 0.40–1.20)
GFR: 87.82 mL/min (ref >60.00)
Glucose, Bld: 86 mg/dL (ref 70–99)
Potassium: 4.4 mEq/L (ref 3.5–5.1)
Sodium: 138 mEq/L (ref 135–145)

## 2020-01-16 LAB — CBC WITH DIFFERENTIAL/PLATELET
Basophils Absolute: 0 10*3/uL (ref 0.0–0.1)
Basophils Relative: 0.9 % (ref 0.0–3.0)
Eosinophils Absolute: 0.1 10*3/uL (ref 0.0–0.7)
Eosinophils Relative: 2.9 % (ref 0.0–5.0)
HCT: 37.9 % (ref 36.0–46.0)
Hemoglobin: 12.7 g/dL (ref 12.0–15.0)
Lymphocytes Relative: 27.3 % (ref 12.0–46.0)
Lymphs Abs: 1.2 10*3/uL (ref 0.7–4.0)
MCHC: 33.7 g/dL (ref 30.0–36.0)
MCV: 93.6 fl (ref 78.0–100.0)
Monocytes Absolute: 0.5 10*3/uL (ref 0.1–1.0)
Monocytes Relative: 12.3 % — ABNORMAL HIGH (ref 3.0–12.0)
Neutro Abs: 2.5 10*3/uL (ref 1.4–7.7)
Neutrophils Relative %: 56.6 % (ref 43.0–77.0)
Platelets: 188 10*3/uL (ref 150.0–400.0)
RBC: 4.05 Mil/uL (ref 3.87–5.11)
RDW: 13.7 % (ref 11.5–15.5)
WBC: 4.4 10*3/uL (ref 4.0–10.5)

## 2020-01-16 LAB — LIPID PANEL
Cholesterol: 169 mg/dL (ref 0–200)
HDL: 54 mg/dL (ref >39.00)
LDL Cholesterol: 105 mg/dL — ABNORMAL HIGH (ref 0–99)
NonHDL: 114.76
Total CHOL/HDL Ratio: 3
Triglycerides: 48 mg/dL (ref 0.0–149.0)
VLDL: 9.6 mg/dL (ref 0.0–40.0)

## 2020-01-16 LAB — HEPATIC FUNCTION PANEL
ALT: 17 U/L (ref 0–35)
AST: 21 U/L (ref 0–37)
Albumin: 4.5 g/dL (ref 3.5–5.2)
Alkaline Phosphatase: 62 U/L (ref 39–117)
Bilirubin, Direct: 0.1 mg/dL (ref 0.0–0.3)
Total Bilirubin: 0.6 mg/dL (ref 0.2–1.2)
Total Protein: 7.1 g/dL (ref 6.0–8.3)

## 2020-01-16 LAB — VITAMIN D 25 HYDROXY (VIT D DEFICIENCY, FRACTURES): VITD: 80.51 ng/mL (ref 30.00–100.00)

## 2020-01-16 LAB — TSH: TSH: 1.35 u[IU]/mL (ref 0.35–4.50)

## 2020-01-16 MED ORDER — SUCRALFATE 1 GM/10ML PO SUSP
1.0000 g | Freq: Three times a day (TID) | ORAL | 0 refills | Status: DC
Start: 2020-01-16 — End: 2020-03-12

## 2020-01-16 MED ORDER — ELETRIPTAN HYDROBROMIDE 40 MG PO TABS: 40.0000 mg | ORAL_TABLET | Freq: Once | ORAL | 6 refills | Status: DC

## 2020-01-16 MED ORDER — PROMETHAZINE HCL 25 MG PO TABS: 25.0000 mg | ORAL_TABLET | Freq: Four times a day (QID) | ORAL | 1 refills | Status: DC | PRN

## 2020-01-16 MED ORDER — CYCLOBENZAPRINE HCL 10 MG PO TABS: 10.0000 mg | ORAL_TABLET | Freq: Three times a day (TID) | ORAL | 3 refills | Status: DC | PRN

## 2020-01-16 MED ORDER — CLONAZEPAM 0.5 MG PO TABS: 0.2500 mg | ORAL_TABLET | Freq: Two times a day (BID) | ORAL | 1 refills | Status: DC | PRN

## 2020-01-16 NOTE — Assessment & Plan Note (Signed)
DEXA ordered.  Check Vit D level and replete prn.

## 2020-01-16 NOTE — Assessment & Plan Note (Signed)
Pt's PE WNL.  UTD on pap, colonoscopy, Tdap.  Due for mammo (pt to schedule) and DEXA (ordered).  Encouraged her to get COVID vaccines.  Declines flu shot.  Check labs.  Anticipatory guidance provided.

## 2020-01-16 NOTE — Progress Notes (Signed)
   Subjective:    Patient ID: Laura Brock, female    DOB: Jun 01, 1960, 59 y.o.   MRN: [id number]  HPI CPE- UTD on Tdap, pap, colonoscopy.  Due for DEXA (ordered) and mammo- GYN.  Recently had COVID and is waiting on shots.  Declines flu.  Reviewed past medical, surgical, family and social histories.   Patient Care Team    Relationship Specialty Notifications Start End  Midge Minium, MD PCP - General Family Medicine  07/04/14   Lollie Sails, MD Referring Physician Internal Medicine  07/03/14   Talbert Forest, [place] Physician Chiropractic Medicine  07/04/14   Tiajuana Amass, MD Referring Physician Allergy and Immunology  07/04/14   Hale Bogus., MD Referring Physician Gastroenterology  07/04/14   Dian Queen, MD Consulting Physician Obstetrics and Gynecology  01/03/15       Review of Systems Patient reports no vision/ hearing changes, adenopathy,fever, weight change,  persistant/recurrent hoarseness , swallowing issues, chest pain, palpitations, edema, persistant/recurrent cough, hemoptysis, dyspnea (rest/exertional/paroxysmal nocturnal), gastrointestinal bleeding (melena, rectal bleeding), abdominal pain, significant heartburn, bowel changes, GU symptoms (dysuria, hematuria, incontinence), Gyn symptoms (abnormal  bleeding, pain),  syncope, focal weakness, memory loss, numbness & tingling, skin/hair/nail changes, abnormal bruising or bleeding, anxiety, or depression.   This visit occurred during the SARS-CoV-2 public health emergency.  Safety protocols were in place, including screening questions prior to the visit, additional usage of staff PPE, and extensive cleaning of exam room while observing appropriate contact time as indicated for disinfecting solutions.       Objective:   Physical Exam General Appearance:    Alert, cooperative, no distress, appears stated age  Head:    Normocephalic, without obvious abnormality, atraumatic  Eyes:    PERRL, conjunctiva/corneas  clear, EOM's intact, fundi    benign, both eyes  Ears:    Normal TM's and external ear canals, both ears  Nose:   Deferred due to COVID  Throat:   Neck:   Supple, symmetrical, trachea midline, no adenopathy;    Thyroid: no enlargement/tenderness/nodules  Back:     Symmetric, no curvature, ROM normal, no CVA tenderness  Lungs:     Clear to auscultation bilaterally, respirations unlabored  Chest Wall:    No tenderness or deformity   Heart:    Regular rate and rhythm, S1 and S2 normal, no murmur, rub   or gallop  Breast Exam:    Deferred to GYN  Abdomen:     Soft, non-tender, bowel sounds active all four quadrants,    no masses, no organomegaly  Genitalia:    Deferred to GYN  Rectal:    Extremities:   Extremities normal, atraumatic, no cyanosis or edema  Pulses:   2+ and symmetric all extremities  Skin:   Skin color, texture, turgor normal, no rashes or lesions  Lymph nodes:   Cervical, supraclavicular, and axillary nodes normal  Neurologic:   CNII-XII intact, normal strength, sensation and reflexes    throughout          Assessment & Plan:

## 2020-01-16 NOTE — Assessment & Plan Note (Signed)
Chronic problem.  Check labs and adjust meds prn. 

## 2020-01-16 NOTE — Patient Instructions (Signed)
Follow up in 1 year or as needed We'll notify you of your lab results and make any changes if needed Keep up the good work on healthy diet and regular exercise- you look great! They should call you to schedule your bone density appt Please call GYN and schedule your mammogram Get your COVID vaccines at your convenience Call with any questions or concerns Stay Safe!! Stay Healthy!!

## 2020-01-22 DIAGNOSIS — G43909 Migraine, unspecified, not intractable, without status migrainosus: Secondary | ICD-10-CM | POA: Insufficient documentation

## 2020-01-29 ENCOUNTER — Encounter: Payer: Self-pay | Admitting: Family Medicine

## 2020-01-30 ENCOUNTER — Other Ambulatory Visit: Payer: Self-pay | Admitting: General Practice

## 2020-01-30 DIAGNOSIS — Z8616 Personal history of COVID-19: Secondary | ICD-10-CM

## 2020-01-30 DIAGNOSIS — R002 Palpitations: Secondary | ICD-10-CM

## 2020-02-05 LAB — HM DEXA SCAN

## 2020-02-06 NOTE — Addendum Note (Signed)
Addended by: Leonidas Romberg on: 02/06/2020 11:53 AM   Modules accepted: Orders

## 2020-03-12 ENCOUNTER — Ambulatory Visit: Payer: BC Managed Care – PPO | Admitting: Cardiology

## 2020-03-12 ENCOUNTER — Encounter: Payer: Self-pay | Admitting: Cardiology

## 2020-03-12 ENCOUNTER — Other Ambulatory Visit: Payer: Self-pay

## 2020-03-12 VITALS — BP 104/70 | HR 62 | Ht 65.0 in | Wt 142.0 lb

## 2020-03-12 DIAGNOSIS — R002 Palpitations: Secondary | ICD-10-CM

## 2020-03-12 DIAGNOSIS — Z7189 Other specified counseling: Secondary | ICD-10-CM | POA: Diagnosis not present

## 2020-03-12 NOTE — Patient Instructions (Signed)
Medication Instructions:  Your physician recommends that you continue on your current medications as directed. Please refer to the Current Medication list given to you today.  *If you need a refill on your cardiac medications before your next appointment, please call your pharmacy*   Testing/Procedures: Your physician has requested that you have an echocardiogram. Echocardiography is a painless test that uses sound waves to create images of your heart. It provides your doctor with information about the size and shape of your heart and how well your heart's chambers and valves are working. This procedure takes approximately one hour. There are no restrictions for this procedure.  Your physician has recommended that you wear an event monitor. Event monitors are medical devices that record the heart's electrical activity. Doctors most often us these monitors to diagnose arrhythmias. Arrhythmias are problems with the speed or rhythm of the heartbeat. The monitor is a small, portable device. You can wear one while you do your normal daily activities. This is usually used to diagnose what is causing palpitations/syncope (passing out).   Follow-Up: At [facility], you and your health needs are our priority.  As part of our continuing mission to provide you with exceptional heart care, we have created designated Provider Care Teams.  These Care Teams include your primary Cardiologist (physician) and Advanced Practice Providers (APPs -  Physician Assistants and Nurse Practitioners) who all work together to provide you with the care you need, when you need it.  Follow up with Dr. Turner as needed based on results of testing 

## 2020-03-12 NOTE — Addendum Note (Signed)
Addended by: Antonieta Iba on: 03/12/2020 10:31 AM   Modules accepted: Orders

## 2020-03-12 NOTE — Progress Notes (Signed)
Cardiology Office Note    Date:  03/12/2020   ID:  Maitri, Schnoebelen Mar 09, 1961, MRN [id number]  PCP:  Midge Minium, MD  Cardiologist:  Fransico Him, MD   Chief Complaint  Patient presents with  . New Patient (Initial Visit)    Palpitations     History of Present Illness:  Laura Brock is a 59 y.o. female who is being seen today for the evaluation of palpitations at the request of Midge Minium, MD.  This is a 59yo female with a hx of COVID 19, GERD, anxiety and migraine HAs.  She is referred today for evaluation of palpitations.  She tells me that she got COVID 19 back in March and was sick for about 5 days with cough and low grade fever.  She took Ivermectin and no antibodies.  She had not been vaccinated at all and waited to check her antibodies in the summer and unfortunately before she got the antibody test, she got COVID 19 again.  She was signed up to get monoclonal Ab but felt better and did not get them.  She did take Ivermectin from her integrative MD.  She had a mild cough and got over it.    Since the COVID 19 she has started walking again and noticed that her chest would feel fluttering in the middle of her chest which has gotten better.  She had noticed even before COVID 19 that she would have a fluttering in her chest. She says that the fluttering is sporadic and definitely worse with caffeine intake.  She denies any chest pain or pressure, SOB, DOE, PND, orthopnea, LE edema, dizziness or syncope. She is compliant with her meds and is tolerating meds with no SE.    Past Medical History:  Diagnosis Date  . Allergy   . Anxiety   . Arthritis   . COVID-19   . Gastritis   . GERD (gastroesophageal reflux disease)   . History of anal fissures   . Hypothyroidism 07/2010  . Internal hemorrhoids   . Migraine   . Non-celiac gluten sensitivity   . Palpitation     Past Surgical History:  Procedure Laterality Date  . WISDOM TOOTH EXTRACTION      Current  Medications: Current Meds  Medication Sig  . Cholecalciferol (VITAMIN D3) 5000 UNITS CAPS Take 2 capsules by mouth daily.  . clonazePAM (KLONOPIN) 0.5 MG tablet Take 0.5 tablets (0.25 mg total) by mouth 2 (two) times daily as needed for anxiety.  . cyclobenzaprine (FLEXERIL) 10 MG tablet Take 1 tablet (10 mg total) by mouth 3 (three) times daily as needed for muscle spasms.  Marland Kitchen DIGESTIVE ENZYMES PO Take by mouth daily.  . magnesium aspartate (MAGINEX) 615 MG tablet Take 615 mg by mouth at bedtime.   . magnesium gluconate (MAGONATE) 500 MG tablet Take by mouth.  . Multiple Vitamin (MULTIVITAMIN) tablet Take 1 tablet by mouth daily.  . Omega-3 Fatty Acids (OMEGA-3 FISH OIL PO) Take 820 mg by mouth 2 (two) times daily.    Allergies:   Latex, Penicillins, Zofran [ondansetron hcl], Dairy aid [lactase], and Gluten meal   Social History   Socioeconomic History  . Marital status: Married    Spouse name: Not on file  . Number of children: 0  . Years of education: Not on file  . Highest education level: Not on file  Occupational History  . Occupation: Oceanographer  Tobacco Use  . Smoking status: Never Smoker  .  Smokeless tobacco: Never Used  Vaping Use  . Vaping Use: Never used  Substance and Sexual Activity  . Alcohol use: Yes    Comment: social  . Drug use: No  . Sexual activity: Not on file  Other Topics Concern  . Not on file  Social History Narrative  . Not on file   Social Determinants of Health   Financial Resource Strain: Not on file  Food Insecurity: Not on file  Transportation Needs: Not on file  Physical Activity: Not on file  Stress: Not on file  Social Connections: Not on file     Family History:  The patient's family history includes Colon polyps in her father; Diabetes in her maternal grandfather; Heart disease in her paternal grandfather; Liver cancer in her father; Thyroid disease in her mother.   ROS:   Please see the history of present illness.     ROS All other systems reviewed and are negative.  No flowsheet data found.     PHYSICAL EXAM:   VS:  BP 104/70   Pulse 62   Ht 5\' 5"  (1.651 m)   Wt 142 lb (64.4 kg)   SpO2 100%   BMI 23.63 kg/m    GEN: Well nourished, well developed, in no acute distress  HEENT: normal  Neck: no JVD, carotid bruits, or masses Cardiac: RRR; no murmurs, rubs, or gallops,no edema.  Intact distal pulses bilaterally.  Respiratory:  clear to auscultation bilaterally, normal work of breathing GI: soft, nontender, nondistended, + BS MS: no deformity or atrophy  Skin: warm and dry, no rash Neuro:  Alert and Oriented x 3, Strength and sensation are intact Psych: euthymic mood, full affect  Wt Readings from Last 3 Encounters:  03/12/20 142 lb (64.4 kg)  01/16/20 139 lb 3.2 oz (63.1 kg)  01/12/19 133 lb 8 oz (60.6 kg)      Studies/Labs Reviewed:   EKG:  EKG is ordered today.  The ekg ordered today demonstrates NSR with no ST changes  Recent Labs: 01/16/2020: ALT 17; BUN 20; Creatinine, Ser 0.74; Hemoglobin 12.7; Platelets 188.0; Potassium 4.4; Sodium 138; TSH 1.35   Lipid Panel    Component Value Date/Time   CHOL 169 01/16/2020 0833   TRIG 48.0 01/16/2020 0833   HDL 54.00 01/16/2020 0833   CHOLHDL 3 01/16/2020 0833   VLDL 9.6 01/16/2020 0833   LDLCALC 105 (H) 01/16/2020 7972    Additional studies/ records that were reviewed today include:  none    ASSESSMENT:    1. Palpitations   2. Educated about COVID-19 virus infection      PLAN:  In order of problems listed above:  1.  Palpitations -I will get a 30 day event monitor to assess for arrhythmias  2.  COVID 19  -she had 2 infections with COVID 19 that she recovered from -will check a 2D echo to assess LVF    Medication Adjustments/Labs and Tests Ordered: Current medicines are reviewed at length with the patient today.  Concerns regarding medicines are outlined above.  Medication changes, Labs and Tests ordered today  are listed in the Patient Instructions below.  There are no Patient Instructions on file for this visit.   Signed, Fransico Him, MD  03/12/2020 10:23 AM    [address], [place], [place]  [address] Phone: [phone]; Fax: [phone]

## 2020-03-31 ENCOUNTER — Ambulatory Visit (INDEPENDENT_AMBULATORY_CARE_PROVIDER_SITE_OTHER): Payer: BC Managed Care – PPO

## 2020-03-31 DIAGNOSIS — Z7189 Other specified counseling: Secondary | ICD-10-CM

## 2020-03-31 DIAGNOSIS — R002 Palpitations: Secondary | ICD-10-CM

## 2020-04-03 ENCOUNTER — Telehealth: Payer: Self-pay | Admitting: Cardiology

## 2020-04-03 NOTE — Telephone Encounter (Signed)
Patient states due to the holiday and having guests in her home there was a delay in applying her heart monitor. She states she applied the monitor on 03/30/20 and states she is concerned that wearing the monitor will interfere with echocardiogram scheduled for 04/11/20. Is she able to have the echocardiogram with the heart monitor on or will she need to reschedule for a date after the monitor cycle is complete? Please advise.

## 2020-04-03 NOTE — Telephone Encounter (Signed)
LMVM. Patient can use the pause feature on her Monitor if it needs to be removed for her Echocardiogram.  Instructed to select pause on monitor cell phone.  Remove patch and monitor battery.  When echo complete insert same battery into a new strip.  Apply strip, select Resume on monitor cell phone.  Touch picture that demonstrates monitor orientation.  Call [employer] at [phone] if additional strips/ electrodes needed.

## 2020-04-11 ENCOUNTER — Other Ambulatory Visit (HOSPITAL_COMMUNITY): Payer: BC Managed Care – PPO

## 2020-05-01 ENCOUNTER — Other Ambulatory Visit: Payer: Self-pay

## 2020-05-01 ENCOUNTER — Ambulatory Visit (HOSPITAL_COMMUNITY): Payer: BC Managed Care – PPO | Attending: Cardiology

## 2020-05-01 DIAGNOSIS — Z7189 Other specified counseling: Secondary | ICD-10-CM | POA: Insufficient documentation

## 2020-05-01 DIAGNOSIS — R002 Palpitations: Secondary | ICD-10-CM | POA: Diagnosis not present

## 2020-05-01 LAB — ECHOCARDIOGRAM COMPLETE
Area-P 1/2: 3.27 cm2
S' Lateral: 2.4 cm

## 2020-09-25 ENCOUNTER — Encounter: Payer: Self-pay | Admitting: *Deleted

## 2020-11-19 ENCOUNTER — Encounter: Payer: Self-pay | Admitting: Internal Medicine

## 2021-01-16 ENCOUNTER — Ambulatory Visit (INDEPENDENT_AMBULATORY_CARE_PROVIDER_SITE_OTHER): Payer: BC Managed Care – PPO | Admitting: Family Medicine

## 2021-01-16 ENCOUNTER — Encounter: Payer: Self-pay | Admitting: Family Medicine

## 2021-01-16 ENCOUNTER — Other Ambulatory Visit: Payer: Self-pay

## 2021-01-16 VITALS — BP 118/70 | HR 61 | Temp 98.5°F | Resp 18 | Ht 65.5 in | Wt 141.8 lb

## 2021-01-16 DIAGNOSIS — M8580 Other specified disorders of bone density and structure, unspecified site: Secondary | ICD-10-CM

## 2021-01-16 DIAGNOSIS — Z0000 Encounter for general adult medical examination without abnormal findings: Secondary | ICD-10-CM

## 2021-01-16 DIAGNOSIS — E038 Other specified hypothyroidism: Secondary | ICD-10-CM | POA: Diagnosis not present

## 2021-01-16 LAB — T3, FREE: T3, Free: 2.2 pg/mL — ABNORMAL LOW (ref 2.3–4.2)

## 2021-01-16 LAB — CBC WITH DIFFERENTIAL/PLATELET
Basophils Absolute: 0 10*3/uL (ref 0.0–0.1)
Basophils Relative: 0.9 % (ref 0.0–3.0)
Eosinophils Absolute: 0.1 10*3/uL (ref 0.0–0.7)
Eosinophils Relative: 3.3 % (ref 0.0–5.0)
HCT: 38.3 % (ref 36.0–46.0)
Hemoglobin: 12.9 g/dL (ref 12.0–15.0)
Lymphocytes Relative: 30.2 % (ref 12.0–46.0)
Lymphs Abs: 1.2 10*3/uL (ref 0.7–4.0)
MCHC: 33.7 g/dL (ref 30.0–36.0)
MCV: 92.7 fl (ref 78.0–100.0)
Monocytes Absolute: 0.5 10*3/uL (ref 0.1–1.0)
Monocytes Relative: 11.2 % (ref 3.0–12.0)
Neutro Abs: 2.2 10*3/uL (ref 1.4–7.7)
Neutrophils Relative %: 54.4 % (ref 43.0–77.0)
Platelets: 178 10*3/uL (ref 150.0–400.0)
RBC: 4.13 Mil/uL (ref 3.87–5.11)
RDW: 12.7 % (ref 11.5–15.5)
WBC: 4.1 10*3/uL (ref 4.0–10.5)

## 2021-01-16 LAB — LIPID PANEL
Cholesterol: 166 mg/dL (ref 0–200)
HDL: 54 mg/dL (ref >39.00)
LDL Cholesterol: 100 mg/dL — ABNORMAL HIGH (ref 0–99)
NonHDL: 112.43
Total CHOL/HDL Ratio: 3
Triglycerides: 60 mg/dL (ref 0.0–149.0)
VLDL: 12 mg/dL (ref 0.0–40.0)

## 2021-01-16 LAB — HEPATIC FUNCTION PANEL
ALT: 17 U/L (ref 0–35)
AST: 22 U/L (ref 0–37)
Albumin: 4.3 g/dL (ref 3.5–5.2)
Alkaline Phosphatase: 55 U/L (ref 39–117)
Bilirubin, Direct: 0.1 mg/dL (ref 0.0–0.3)
Total Bilirubin: 0.6 mg/dL (ref 0.2–1.2)
Total Protein: 6.7 g/dL (ref 6.0–8.3)

## 2021-01-16 LAB — BASIC METABOLIC PANEL
BUN: 18 mg/dL (ref 6–23)
CO2: 27 mEq/L (ref 19–32)
Calcium: 8.9 mg/dL (ref 8.4–10.5)
Chloride: 106 mEq/L (ref 96–112)
Creatinine, Ser: 0.79 mg/dL (ref 0.40–1.20)
GFR: 80.76 mL/min (ref >60.00)
Glucose, Bld: 101 mg/dL — ABNORMAL HIGH (ref 70–99)
Potassium: 4.1 mEq/L (ref 3.5–5.1)
Sodium: 140 mEq/L (ref 135–145)

## 2021-01-16 LAB — T4, FREE: Free T4: 0.52 ng/dL — ABNORMAL LOW (ref 0.60–1.60)

## 2021-01-16 LAB — TSH: TSH: 2.13 u[IU]/mL (ref 0.35–5.50)

## 2021-01-16 LAB — VITAMIN D 25 HYDROXY (VIT D DEFICIENCY, FRACTURES): VITD: 73.95 ng/mL (ref 30.00–100.00)

## 2021-01-16 MED ORDER — CYCLOBENZAPRINE HCL 10 MG PO TABS: 10.0000 mg | ORAL_TABLET | Freq: Three times a day (TID) | ORAL | 3 refills | Status: DC | PRN

## 2021-01-16 MED ORDER — CLONAZEPAM 0.5 MG PO TABS: 0.2500 mg | ORAL_TABLET | Freq: Two times a day (BID) | ORAL | 1 refills | Status: DC | PRN

## 2021-01-16 MED ORDER — ELETRIPTAN HYDROBROMIDE 40 MG PO TABS: 40.0000 mg | ORAL_TABLET | Freq: Once | ORAL | 6 refills | Status: DC

## 2021-01-16 NOTE — Progress Notes (Signed)
Subjective:    Patient ID: Laura Brock, female    DOB: [birth year], 60 y.o.   MRN: [id number]  HPI CPE- UTD on Tdap, pap.  Colonoscopy is scheduled.  Mammo is being rescheduled.  Declines flu shot.  Patient Care Team    Relationship Specialty Notifications Start End  Midge Minium, MD PCP - General Family Medicine  07/04/14   Lollie Sails, MD Referring Physician Internal Medicine  07/03/14   Talbert Forest, [place] Physician Chiropractic Medicine  07/04/14   Tiajuana Amass, MD Referring Physician Allergy and Immunology  07/04/14   Hale Bogus., MD Referring Physician Gastroenterology  07/04/14   Dian Queen, MD Consulting Physician Obstetrics and Gynecology  01/03/15     Health Maintenance  Topic Date Due   Zoster Vaccines- Shingrix (1 of 2) Never done   MAMMOGRAM  06/12/2019   COLONOSCOPY (Pts 45-11yrs Insurance coverage will need to be confirmed)  03/30/2020   COVID-19 Vaccine (1) 02/01/2021 (Originally 03/04/1961)   INFLUENZA VACCINE  06/27/2021 (Originally 10/28/2020)   Hepatitis C Screening  01/16/2022 (Originally 09/02/1977)   HIV Screening  01/16/2022 (Originally 09/03/1974)   PAP SMEAR-Modifier  06/11/2021   DEXA SCAN  02/04/2022   TETANUS/TDAP  01/06/2026   Pneumococcal Vaccine 57-70 Years old  Aged Out   HPV VACCINES  Aged Out      Review of Systems Patient reports no vision/ hearing changes, adenopathy,fever, weight change,  persistant/recurrent hoarseness , swallowing issues, chest pain, palpitations, edema, persistant/recurrent cough, hemoptysis, dyspnea (rest/exertional/paroxysmal nocturnal), gastrointestinal bleeding (melena, rectal bleeding), abdominal pain, significant heartburn, bowel changes, GU symptoms (dysuria, hematuria, incontinence), Gyn symptoms (abnormal  bleeding, pain),  syncope, focal weakness, memory loss, numbness & tingling, skin/hair/nail changes, abnormal bruising or bleeding, anxiety, or depression.   This visit occurred during the  SARS-CoV-2 public health emergency.  Safety protocols were in place, including screening questions prior to the visit, additional usage of staff PPE, and extensive cleaning of exam room while observing appropriate contact time as indicated for disinfecting solutions.      Objective:   Physical Exam General Appearance:    Alert, cooperative, no distress, appears stated age  Head:    Normocephalic, without obvious abnormality, atraumatic  Eyes:    PERRL, conjunctiva/corneas clear, EOM's intact, fundi    benign, both eyes  Ears:    Normal TM's and external ear canals, both ears  Nose:   Deferred due to COVID  Throat:   Neck:   Supple, symmetrical, trachea midline, no adenopathy;    Thyroid: no enlargement/tenderness/nodules  Back:     Symmetric, no curvature, ROM normal, no CVA tenderness  Lungs:     Clear to auscultation bilaterally, respirations unlabored  Chest Wall:    No tenderness or deformity   Heart:    Regular rate and rhythm, S1 and S2 normal, no murmur, rub   or gallop  Breast Exam:    Deferred to mammo  Abdomen:     Soft, non-tender, bowel sounds active all four quadrants,    no masses, no organomegaly  Genitalia:    Deferred to GYN  Rectal:    Extremities:   Extremities normal, atraumatic, no cyanosis or edema  Pulses:   2+ and symmetric all extremities  Skin:   Skin color, texture, turgor normal, no rashes or lesions  Lymph nodes:   Cervical, supraclavicular, and axillary nodes normal  Neurologic:   CNII-XII intact, normal strength, sensation and reflexes    throughout  Assessment & Plan:

## 2021-01-16 NOTE — Assessment & Plan Note (Signed)
Pt's PE WNL.  UTD on pap, Tdap.  Colonoscopy scheduled and mammo is being rescheduled.  Declines flu shot.  Will get shingles at the pharmacy.  Check labs.  Anticipatory guidance provided.

## 2021-01-16 NOTE — Addendum Note (Signed)
Addended by: Midge Minium on: 01/16/2021 08:14 AM   Modules accepted: Orders

## 2021-01-16 NOTE — Assessment & Plan Note (Signed)
Ongoing issue for pt.  Check labs.  Adjust meds prn

## 2021-01-16 NOTE — Assessment & Plan Note (Signed)
Check Vit D and replete prn. 

## 2021-01-16 NOTE — Patient Instructions (Addendum)
Follow up in 1 year or as needed We'll notify you of your lab results and make any changes if needed Continue to work on healthy diet and regular exercise- you're doing great!! Have them send me a copy of your mammogram so I can update your chart Get your shingles shot at your convenience Call with any questions or concerns Stay Safe!  Stay Healthy! Happy Fall!!

## 2021-01-17 ENCOUNTER — Ambulatory Visit (AMBULATORY_SURGERY_CENTER): Payer: Self-pay

## 2021-01-17 ENCOUNTER — Other Ambulatory Visit: Payer: Self-pay

## 2021-01-17 ENCOUNTER — Encounter: Payer: Self-pay | Admitting: Internal Medicine

## 2021-01-17 ENCOUNTER — Encounter: Payer: Self-pay | Admitting: Family Medicine

## 2021-01-17 DIAGNOSIS — Z1211 Encounter for screening for malignant neoplasm of colon: Secondary | ICD-10-CM

## 2021-01-17 DIAGNOSIS — E038 Other specified hypothyroidism: Secondary | ICD-10-CM

## 2021-01-17 MED ORDER — SUTAB 1479-225-188 MG PO TABS: 1.0000 | ORAL_TABLET | ORAL | 0 refills | Status: DC

## 2021-01-17 MED ORDER — LEVOTHYROXINE SODIUM 25 MCG PO TABS: 25.0000 ug | ORAL_TABLET | Freq: Every day | ORAL | 3 refills | Status: DC

## 2021-01-17 NOTE — Progress Notes (Signed)
Levothyroxine

## 2021-01-17 NOTE — Progress Notes (Signed)
Patient is here in-person for PV. Patient denies any allergies to eggs or soy. Patient denies any problems with anesthesia/sedation. Patient is not on any oxygen at home. Patient is not taking any diet/weight loss medications or blood thinners. Patient is aware of our care-partner policy and WGNFA-21 safety protocol.   EMMI education assigned to the patient for the procedure, sent to [place].   Patient is COVID-19 vaccinated.   Sutab Prep Prescription coupon was given to the patient. And sent to pharmacy.

## 2021-01-21 LAB — T3, REVERSE: T3, Reverse: 10 ng/dL (ref 8–25)

## 2021-01-22 MED ORDER — PROMETHAZINE HCL 25 MG PO TABS: 25.0000 mg | ORAL_TABLET | Freq: Three times a day (TID) | ORAL | 0 refills | Status: DC | PRN

## 2021-01-31 ENCOUNTER — Ambulatory Visit (AMBULATORY_SURGERY_CENTER): Payer: BC Managed Care – PPO | Admitting: Internal Medicine

## 2021-01-31 ENCOUNTER — Other Ambulatory Visit: Payer: Self-pay

## 2021-01-31 ENCOUNTER — Encounter: Payer: Self-pay | Admitting: Internal Medicine

## 2021-01-31 VITALS — BP 105/63 | HR 55 | Temp 96.4°F | Resp 19 | Ht 65.0 in | Wt 141.0 lb

## 2021-01-31 DIAGNOSIS — K635 Polyp of colon: Secondary | ICD-10-CM

## 2021-01-31 DIAGNOSIS — K6389 Other specified diseases of intestine: Secondary | ICD-10-CM | POA: Diagnosis not present

## 2021-01-31 DIAGNOSIS — Z1211 Encounter for screening for malignant neoplasm of colon: Secondary | ICD-10-CM | POA: Diagnosis present

## 2021-01-31 DIAGNOSIS — D124 Benign neoplasm of descending colon: Secondary | ICD-10-CM

## 2021-01-31 MED ORDER — SODIUM CHLORIDE 0.9 % IV SOLN: 500.0000 mL | Freq: Once | INTRAVENOUS | Status: DC

## 2021-01-31 NOTE — Progress Notes (Signed)
Called to room to assist during endoscopic procedure.  Patient ID and intended procedure confirmed with present staff. Received instructions for my participation in the procedure from the performing physician.  

## 2021-01-31 NOTE — Progress Notes (Signed)
GASTROENTEROLOGY PROCEDURE H&P NOTE   Primary Care Physician: Midge Minium, MD    Reason for Procedure:  Colon cancer screening  Plan:    Colonoscopy  Patient is appropriate for endoscopic procedure(s) in the ambulatory ([place]) setting.  The nature of the procedure, as well as the risks, benefits, and alternatives were carefully and thoroughly reviewed with the patient. Ample time for discussion and questions allowed. The patient understood, was satisfied, and agreed to proceed.     HPI: Laura Brock is a 60 y.o. female who presents for screening colonoscopy.  Last exam 10 years ago.  Medical history as below.  Tolerated the prep.  No chest pain or shortness of breath.  Past Medical History:  Diagnosis Date   Allergy    Anxiety    Arthritis    COVID-19    Gastritis    GERD (gastroesophageal reflux disease)    History of anal fissures    Hypothyroidism 07/2010   Internal hemorrhoids    Migraine    Non-celiac gluten sensitivity    Palpitation     Past Surgical History:  Procedure Laterality Date   WISDOM TOOTH EXTRACTION      Prior to Admission medications   Medication Sig Start Date End Date Taking? Authorizing Provider  levothyroxine (SYNTHROID) 25 MCG tablet Take 1 tablet (25 mcg total) by mouth daily. 01/17/21  Yes Midge Minium, MD  b complex vitamins capsule Take 1 capsule by mouth daily.    [provider]  Cholecalciferol (VITAMIN D3) 5000 UNITS CAPS Take 2 capsules by mouth daily.    [provider]  clonazePAM (KLONOPIN) 0.5 MG tablet Take 0.5 tablets (0.25 mg total) by mouth 2 (two) times daily as needed for anxiety. 01/16/21   Midge Minium, MD  cyclobenzaprine (FLEXERIL) 10 MG tablet Take 1 tablet (10 mg total) by mouth 3 (three) times daily as needed for muscle spasms. 01/16/21   Midge Minium, MD  diazepam (VALIUM) 5 MG tablet SMARTSIG:1 Tablet(s) Vaginal PRN Patient not taking: Reported on 01/31/2021 01/09/21    [provider]  DIGESTIVE ENZYMES PO Take by mouth daily.    [provider]  eletriptan (RELPAX) 40 MG tablet Take 1 tablet (40 mg total) by mouth once for 1 dose. One tablet by mouth at onset of headache. May repeat in 2 hours if needed 01/16/21 01/16/21  Midge Minium, MD  magnesium aspartate (MAGINEX) 615 MG tablet Take 615 mg by mouth at bedtime.     [provider]  Multiple Vitamin (MULTIVITAMIN) tablet Take 1 tablet by mouth daily.    [provider]  Omega-3 Fatty Acids (OMEGA-3 FISH OIL PO) Take 820 mg by mouth 2 (two) times daily.    [provider]  promethazine (PHENERGAN) 25 MG tablet Take 1 tablet (25 mg total) by mouth every 8 (eight) hours as needed for nausea or vomiting. 01/22/21   Midge Minium, MD  Turmeric (QC TUMERIC COMPLEX PO) Take by mouth.    [provider]    Current Outpatient Medications  Medication Sig Dispense Refill   levothyroxine (SYNTHROID) 25 MCG tablet Take 1 tablet (25 mcg total) by mouth daily. 90 tablet 3   b complex vitamins capsule Take 1 capsule by mouth daily.     Cholecalciferol (VITAMIN D3) 5000 UNITS CAPS Take 2 capsules by mouth daily.     clonazePAM (KLONOPIN) 0.5 MG tablet Take 0.5 tablets (0.25 mg total) by mouth 2 (two) times daily  as needed for anxiety. 30 tablet 1   cyclobenzaprine (FLEXERIL) 10 MG tablet Take 1 tablet (10 mg total) by mouth 3 (three) times daily as needed for muscle spasms. 30 tablet 3   diazepam (VALIUM) 5 MG tablet SMARTSIG:1 Tablet(s) Vaginal PRN (Patient not taking: Reported on 01/31/2021)     DIGESTIVE ENZYMES PO Take by mouth daily.     eletriptan (RELPAX) 40 MG tablet Take 1 tablet (40 mg total) by mouth once for 1 dose. One tablet by mouth at onset of headache. May repeat in 2 hours if needed 10 tablet 6   magnesium aspartate (MAGINEX) 615 MG tablet Take 615 mg by mouth at bedtime.      Multiple Vitamin (MULTIVITAMIN) tablet Take 1 tablet by mouth  daily.     Omega-3 Fatty Acids (OMEGA-3 FISH OIL PO) Take 820 mg by mouth 2 (two) times daily.     promethazine (PHENERGAN) 25 MG tablet Take 1 tablet (25 mg total) by mouth every 8 (eight) hours as needed for nausea or vomiting. 20 tablet 0   Turmeric (QC TUMERIC COMPLEX PO) Take by mouth.     Current Facility-Administered Medications  Medication Dose Route Frequency Provider Last Rate Last Admin   0.9 %  sodium chloride infusion  500 mL Intravenous Once Myracle Febres, Lajuan Lines, MD        Allergies as of 01/31/2021 - Review Complete 01/31/2021  Allergen Reaction Noted   Latex Rash    Penicillins Swelling 09/28/2008   Zofran [ondansetron hcl]  02/18/2015   Dairy aid [tilactase]  12/14/2017   Gluten meal  12/14/2017    Family History  Problem Relation Age of Onset   Thyroid disease Mother    Colon polyps Father    Liver cancer Father    Colon polyps Sister    Diabetes Maternal Grandfather    Heart disease Paternal Grandfather    Liver disease Neg Hx    Colon cancer Neg Hx    Esophageal cancer Neg Hx    Rectal cancer Neg Hx    Stomach cancer Neg Hx     Social History   Socioeconomic History   Marital status: Married    Spouse name: Not on file   Number of children: 0   Years of education: Not on file   Highest education level: Not on file  Occupational History   Occupation: Client specialist  Tobacco Use   Smoking status: Never   Smokeless tobacco: Never  Vaping Use   Vaping Use: Never used  Substance and Sexual Activity   Alcohol use: Yes    Comment: social   Drug use: No   Sexual activity: Not on file  Other Topics Concern   Not on file  Social History Narrative   Not on file   Social Determinants of Health   Financial Resource Strain: Not on file  Food Insecurity: Not on file  Transportation Needs: Not on file  Physical Activity: Not on file  Stress: Not on file  Social Connections: Not on file  Intimate Partner Violence: Not on file    Physical  Exam: Vital signs in last 24 hours: @BP  (!) 99/56   Pulse 64   Temp (!) 96.4 F (35.8 C) (Skin)   Ht 5\' 5"  (1.651 m)   Wt 141 lb (64 kg)   SpO2 100%   BMI 23.46 kg/m  GEN: NAD EYE: Sclerae anicteric ENT: MMM CV: Non-tachycardic Pulm: CTA b/l GI: Soft, NT/ND NEURO:  Alert & Oriented x  [place], MD [facility]  01/31/2021 9:13 AM

## 2021-01-31 NOTE — Op Note (Signed)
[place] Patient Name: Laura Brock Procedure Date: 01/31/2021 9:15 AM MRN: [id number] Endoscopist: Jerene Bears , MD Age: 60 Referring MD:  Date of Birth: [birth year] Gender: Female Account #: [id number] Procedure:                Colonoscopy Indications:              Screening for colorectal malignant neoplasm, Last                            colonoscopy 10 years ago Medicines:                Monitored Anesthesia Care Procedure:                Pre-Anesthesia Assessment:                           - Prior to the procedure, a History and Physical                            was performed, and patient medications and                            allergies were reviewed. The patient's tolerance of                            previous anesthesia was also reviewed. The risks                            and benefits of the procedure and the sedation                            options and risks were discussed with the patient.                            All questions were answered, and informed consent                            was obtained. Prior Anticoagulants: The patient has                            taken no previous anticoagulant or antiplatelet                            agents. ASA Grade Assessment: II - A patient with                            mild systemic disease. After reviewing the risks                            and benefits, the patient was deemed in                            satisfactory condition to undergo the procedure.  After obtaining informed consent, the colonoscope                            was passed under direct vision. Throughout the                            procedure, the patient's blood pressure, pulse, and                            oxygen saturations were monitored continuously. The                            Olympus PCF-H190DL (#[id number]) Colonoscope was                            introduced through the anus and advanced to  the                            cecum, identified by appendiceal orifice and                            ileocecal valve. The colonoscopy was performed                            without difficulty. The patient tolerated the                            procedure well. The quality of the bowel                            preparation was good. The ileocecal valve,                            appendiceal orifice, and rectum were photographed. Scope In: 9:23:52 AM Scope Out: 9:43:54 AM Scope Withdrawal Time: 0 hours 12 minutes 30 seconds  Total Procedure Duration: 0 hours 20 minutes 2 seconds  Findings:                 The digital rectal exam was normal.                           A 6 mm polyp was found in the descending colon. The                            polyp was sessile. The polyp was removed with a                            cold snare. Resection and retrieval were complete.                           The exam was otherwise without abnormality on                            direct and retroflexion views. Complications:  No immediate complications. Estimated Blood Loss:     Estimated blood loss was minimal. Impression:               - One 6 mm polyp in the descending colon, removed                            with a cold snare. Resected and retrieved.                           - The examination was otherwise normal on direct                            and retroflexion views. Recommendation:           - Patient has a contact number available for                            emergencies. The signs and symptoms of potential                            delayed complications were discussed with the                            patient. Return to normal activities tomorrow.                            Written discharge instructions were provided to the                            patient.                           - Resume previous diet.                           - Continue present medications.                            - Await pathology results.                           - Repeat colonoscopy is recommended. The                            colonoscopy date will be determined after pathology                            results from today's exam become available for                            review. Jerene Bears, MD 01/31/2021 9:45:35 AM This report has been signed electronically.

## 2021-01-31 NOTE — Patient Instructions (Signed)
Resume previous diet and continue current medications. Awaiting pathology results. Repeat colonoscopy for surveillance based on pathology results.  YOU HAD AN ENDOSCOPIC PROCEDURE TODAY AT [facility]:   Refer to the procedure report that was given to you for any specific questions about what was found during the examination.  If the procedure report does not answer your questions, please call your gastroenterologist to clarify.  If you requested that your care partner not be given the details of your procedure findings, then the procedure report has been included in a sealed envelope for you to review at your convenience later.  YOU SHOULD EXPECT: Some feelings of bloating in the abdomen. Passage of more gas than usual.  Walking can help get rid of the air that was put into your GI tract during the procedure and reduce the bloating. If you had a lower endoscopy (such as a colonoscopy or flexible sigmoidoscopy) you may notice spotting of blood in your stool or on the toilet paper. If you underwent a bowel prep for your procedure, you may not have a normal bowel movement for a few days.  Please Note:  You might notice some irritation and congestion in your nose or some drainage.  This is from the oxygen used during your procedure.  There is no need for concern and it should clear up in a day or so.  SYMPTOMS TO REPORT IMMEDIATELY:  Following lower endoscopy (colonoscopy or flexible sigmoidoscopy):  Excessive amounts of blood in the stool  Significant tenderness or worsening of abdominal pains  Swelling of the abdomen that is new, acute  Fever of 100F or higher   For urgent or emergent issues, a gastroenterologist can be reached at any hour by calling [phone]. Do not use MyChart messaging for urgent concerns.    DIET:  We do recommend a small meal at first, but then you may proceed to your regular diet.  Drink plenty of fluids but you should avoid alcoholic beverages for  24 hours.  ACTIVITY:  You should plan to take it easy for the rest of today and you should NOT DRIVE or use heavy machinery until tomorrow (because of the sedation medicines used during the test).    FOLLOW UP: Our staff will call the number listed on your records 48-72 hours following your procedure to check on you and address any questions or concerns that you may have regarding the information given to you following your procedure. If we do not reach you, we will leave a message.  We will attempt to reach you two times.  During this call, we will ask if you have developed any symptoms of COVID 19. If you develop any symptoms (ie: fever, flu-like symptoms, shortness of breath, cough etc.) before then, please call [phone].  If you test positive for Covid 19 in the 2 weeks post procedure, please call and report this information to [place].    If any biopsies were taken you will be contacted by phone or by letter within the next 1-3 weeks.  Please call us at [phone] if you have not heard about the biopsies in 3 weeks.    SIGNATURES/CONFIDENTIALITY: You and/or your care partner have signed paperwork which will be entered into your electronic medical record.  These signatures attest to the fact that that the information above on your After Visit Summary has been reviewed and is understood.  Full responsibility of the confidentiality of this discharge information lies with you and/or your care-partner.

## 2021-01-31 NOTE — Progress Notes (Signed)
VS by DT  Pt's states no medical or surgical changes since previsit or office visit.  

## 2021-01-31 NOTE — Progress Notes (Signed)
To PACU< VSS. Report to [email or web address].tb 

## 2021-02-04 ENCOUNTER — Telehealth: Payer: Self-pay | Admitting: *Deleted

## 2021-02-04 NOTE — Telephone Encounter (Signed)
  Follow up Call-  Call back number 01/31/2021  Post procedure Call Back phone  # [phone]  Permission to leave phone message Yes  Some recent data might be hidden     Patient questions:  Do you have a fever, pain , or abdominal swelling? No. Pain Score  0 *  Have you tolerated food without any problems? Yes.    Have you been able to return to your normal activities? Yes.    Do you have any questions about your discharge instructions: Diet   No. Medications  No. Follow up visit  No.  Do you have questions or concerns about your Care? No.  Actions: * If pain score is 4 or above: No action needed, pain <4.  Have you developed a fever since your procedure? no  2.   Have you had an respiratory symptoms (SOB or cough) since your procedure? no  3.   Have you tested positive for COVID 19 since your procedure no  4.   Have you had any family members/close contacts diagnosed with the COVID 19 since your procedure?  no   If yes to any of these questions please route to Joylene John, RN and Joella Prince, RN

## 2021-02-06 ENCOUNTER — Encounter: Payer: Self-pay | Admitting: Internal Medicine

## 2021-02-24 ENCOUNTER — Encounter: Payer: Self-pay | Admitting: Family Medicine

## 2021-02-24 MED ORDER — SUCRALFATE 1 G PO TABS: 1.0000 g | ORAL_TABLET | Freq: Three times a day (TID) | ORAL | 0 refills | Status: DC

## 2021-03-18 ENCOUNTER — Other Ambulatory Visit: Payer: Self-pay | Admitting: Family Medicine

## 2021-04-11 LAB — HM MAMMOGRAPHY

## 2021-04-16 ENCOUNTER — Encounter: Payer: Self-pay | Admitting: Family Medicine

## 2021-06-09 NOTE — Progress Notes (Deleted)
? ? ?06/09/2021 ?Laura Brock ?[id number] ?[birth year] ? ? ?ASSESSMENT AND PLAN:  ?*** ?There are no diagnoses linked to this encounter. ? ? ?History of Present Illness:  ?61 y.o. female  with a past medical history of gluten and dairy sensitivities, migraines, hypothyroidism, internal hemorrhoids and others listed below, known to Dr. Hilarie Fredrickson returns to clinic today for evaluation of abdominal pain. ?Last seen in the office 12/14/2017 for reflux, epigastric abdominal pain, scheduled for endoscopy. ?01/31/2021 screening colonoscopy 6 mm benign polyp recall 10 years ?12/29/2017 endoscopy for epigastric abdominal pain negative H. pylori gastritis duodenitis ?12/20/2017 abdominal ultrasound for epigastric abdominal pain normally distended gallbladder without stones or wall thickening, normal liver, normal pancreas, normal spleen ?Previous GI history: ? ?No results found. ? ?Current Medications:  ? ?Current Outpatient Medications (Endocrine & Metabolic):  ?  levothyroxine (SYNTHROID) 25 MCG tablet, Take 1 tablet (25 mcg total) by mouth daily. ? ? ?Current Outpatient Medications (Respiratory):  ?  promethazine (PHENERGAN) 25 MG tablet, Take 1 tablet (25 mg total) by mouth every 8 (eight) hours as needed for nausea or vomiting. ? ?Current Outpatient Medications (Analgesics):  ?  eletriptan (RELPAX) 40 MG tablet, Take 1 tablet (40 mg total) by mouth once for 1 dose. One tablet by mouth at onset of headache. May repeat in 2 hours if needed ? ? ?Current Outpatient Medications (Other):  ?  b complex vitamins capsule, Take 1 capsule by mouth daily. ?  Cholecalciferol (VITAMIN D3) 5000 UNITS CAPS, Take 2 capsules by mouth daily. ?  clonazePAM (KLONOPIN) 0.5 MG tablet, Take 0.5 tablets (0.25 mg total) by mouth 2 (two) times daily as needed for anxiety. ?  cyclobenzaprine (FLEXERIL) 10 MG tablet, Take 1 tablet (10 mg total) by mouth 3 (three) times daily as needed for muscle spasms. ?  diazepam (VALIUM) 5 MG tablet, SMARTSIG:1 Tablet(s)  Vaginal PRN (Patient not taking: Reported on 01/31/2021) ?  DIGESTIVE ENZYMES PO, Take by mouth daily. ?  magnesium aspartate (MAGINEX) 615 MG tablet, Take 615 mg by mouth at bedtime.  ?  Multiple Vitamin (MULTIVITAMIN) tablet, Take 1 tablet by mouth daily. ?  Omega-3 Fatty Acids (OMEGA-3 FISH OIL PO), Take 820 mg by mouth 2 (two) times daily. ?  sucralfate (CARAFATE) 1 g tablet, TAKE 1 TABLET (1 G TOTAL) BY MOUTH 4 TIMES A DAY WITH MEALS AND AT BEDTIME ?  Turmeric (QC TUMERIC COMPLEX PO), Take by mouth. ? ?Surgical History:  ?She  has a past surgical history that includes Wisdom tooth extraction. ?Family History:  ?Her family history includes Colon polyps in her father and sister; Diabetes in her maternal grandfather; Heart disease in her paternal grandfather; Liver cancer in her father; Thyroid disease in her mother. ?Social History:  ? reports that she has never smoked. She has never used smokeless tobacco. She reports current alcohol use. She reports that she does not use drugs. ? ?Current Medications, Allergies, Past Medical History, Past Surgical History, Family History and Social History were reviewed in [employer] record. ? ?Physical Exam: ?There were no vitals taken for this visit. ?General:   Pleasant, well developed female in no acute distress ?Eyes: {sclerae:26738},conjunctive {conjuctiva:26739}  ?Heart:  {HEART EXAM HEM/ONC:21750} ?Pulm: Clear anteriorly; no wheezing ?Abdomen:  {BlankSingle:19197::"Distended","Ridged","Soft"}, {BlankSingle:19197::"Flat","Obese"} AB, skin exam {ABDOMEN SKIN EXAM:22649}, {BlankSingle:19197::"Absent","Hyperactive, tinkling","Hypoactive","Sluggish","Normal"} bowel sounds. {Desc; pc desc - abdomen tenderness:5168} tenderness {anatomy; site abdomen:5010}. {BlankMultiple:19196::"Without guarding","With guarding","Without rebound","With rebound"}, {Exam; abdomen organomegaly:15152}. ?Extremities:  {With/Without:304960234} edema. Peripheral pulses intact.   ?Neurologic:  Alert and  oriented x4;  grossly normal neurologically. ?Skin:   Dry and intact without significant lesions or rashes. ?Psychiatric: Demonstrates good judgement and reason without abnormal affect or behaviors. ? ?Vladimir Crofts, PA-C ?06/09/21 ?

## 2021-06-10 ENCOUNTER — Encounter: Payer: Self-pay | Admitting: Family Medicine

## 2021-06-11 ENCOUNTER — Ambulatory Visit: Payer: BC Managed Care – PPO | Admitting: Physician Assistant

## 2021-08-12 ENCOUNTER — Other Ambulatory Visit: Payer: Self-pay | Admitting: Family Medicine

## 2021-08-26 ENCOUNTER — Telehealth: Payer: Self-pay | Admitting: Family Medicine

## 2021-08-26 ENCOUNTER — Other Ambulatory Visit: Payer: Self-pay | Admitting: Registered Nurse

## 2021-08-26 MED ORDER — SUCRALFATE 1 GM/10ML PO SUSP: 1.0000 g | Freq: Three times a day (TID) | ORAL | 0 refills | Status: DC

## 2021-08-26 NOTE — Telephone Encounter (Signed)
Pt called in stating that the sucralfate tablet is hard to swallow, she wanted to know if the liquid could be sent in for her. Pt uses [facility] on [address]

## 2021-08-26 NOTE — Telephone Encounter (Signed)
Sent ? ?Thanks, ? ?Rich

## 2021-08-26 NOTE — Telephone Encounter (Signed)
Can you send this in for Dr Birdie Riddle since she is out of the office for the day

## 2021-09-18 ENCOUNTER — Encounter: Payer: Self-pay | Admitting: Nurse Practitioner

## 2021-09-18 ENCOUNTER — Ambulatory Visit: Payer: BC Managed Care – PPO | Admitting: Nurse Practitioner

## 2021-09-18 VITALS — BP 100/60 | HR 72 | Ht 64.5 in | Wt 144.4 lb

## 2021-09-18 DIAGNOSIS — R1010 Upper abdominal pain, unspecified: Secondary | ICD-10-CM

## 2021-09-18 MED ORDER — SUCRALFATE 1 G PO TABS: 1.0000 g | ORAL_TABLET | Freq: Three times a day (TID) | ORAL | 1 refills | Status: AC

## 2021-09-18 NOTE — Progress Notes (Signed)
Chief Complaint: burning upper abdominal pain    Assessment &  Plan   61 yo female with a past medical history of gluten sensitivity, migraines, gastritis  Recurrent mid upper abdominal burning discomfort, worse on empty stomach. Improving with Carafate and recent OTC Prilosec.  History of gastroduodenitis on EGD in 2019. Reactive gastropathy on biopsies, no H. Pylori  Since she is improving and this pain is similar to before I don't think she needs a repeat EGD  (or other testing) right now.   For now will resume daily Prilosec OTC ( I asked her to make sure it is 20 mg that she has at home) every morning 30 minutes before breakfast for 4 weeks .  Continue Carafate but increase dose to three times day for 4 weeks. Takes thyroid medication BID so it can be difficult to time carafate dosing. Try to take the carafate before lunch, before dinner and at bedtime.  Cautioned about potential for constipation. Can take daily Miralax if needed .  Follow up with me in about 6 weeks to evaluate how things are going after completion of treatment. Call in interim in getting worse.  No anti-inflammatory use.   HPI   Patient 61 year old female known to Dr. Hilarie Fredrickson.  She has previously been evaluated here for abdominal pain and constipation. Prior to establishing care with [place] she was evaluated by GI in [facility]  ( Dr. Shana Chute) for similar pain. She has also been seen by [facility]. She has a family history of colon polyps in her father and sister.   Yarelis is here with intermittent, localized mid upper abdominal pain since February. This is similar pain when we previously evaluated her and she underwent EGD. Pain feels like " a sore" . It is a gnawing discomfort. It is not exacerbated by eating in fact she thinks eating helps. Pain not related to movement / physical activity. When pain in February she began Prilosec daily for about about a month and got better.   Pain returned in April and she resumed  Prilosec . She didn't totally get better and was also concerned about being on the PPI for extended period.  She stopped the Prilosec two weeks ago and started BID Carafate ( had some at home) and is definitely feeling better. She is eating okay. No blood in stool. Her bowel movements have been normal. She very rarely takes any anti-inflammatoriies.   Previous GI Evaluation  Oct 2019 EGD for epigastric pain - Normal esophagus. - Mild gastritis. Biopsied. - Mild bulbar duodenitis. - Normal second portion of the duodenum. Biopsies c/w reactive gastropathy.    Nov 2022 screening colonoscopy --6  mm polyp.  Path compatible with colonic mucosa   Labs:     Latest Ref Rng & Units 01/16/2021    8:18 AM 01/16/2020    8:33 AM 01/12/2019    8:42 AM  CBC  WBC 4.0 - 10.5 K/uL 4.1  4.4  3.6   Hemoglobin 12.0 - 15.0 g/dL 12.9  12.7  13.0   Hematocrit 36.0 - 46.0 % 38.3  37.9  38.2   Platelets 150.0 - 400.0 K/uL 178.0  188.0  170.0        Latest Ref Rng & Units 01/16/2021    8:18 AM 01/16/2020    8:33 AM 01/12/2019    8:42 AM  Hepatic Function  Total Protein 6.0 - 8.3 g/dL 6.7  7.1  7.0   Albumin 3.5 - 5.2 g/dL 4.3  4.5  4.6   AST 0 - 37 U/L 22  21  22    ALT 0 - 35 U/L 17  17  21    Alk Phosphatase 39 - 117 U/L 55  62  54   Total Bilirubin 0.2 - 1.2 mg/dL 0.6  0.6  0.7   Bilirubin, Direct 0.0 - 0.3 mg/dL 0.1  0.1  0.1      Past Medical History:  Diagnosis Date   Allergy    Anxiety    Arthritis    COVID-19    Gastritis    GERD (gastroesophageal reflux disease)    History of anal fissures    Hypothyroidism 07/2010   Internal hemorrhoids    Migraine    Non-celiac gluten sensitivity    Palpitation     Past Surgical History:  Procedure Laterality Date   WISDOM TOOTH EXTRACTION      Current Medications, Allergies, Family History and Social History were reviewed in [employer] record.     Current Outpatient Medications  Medication Sig Dispense  Refill   b complex vitamins capsule Take 1 capsule by mouth daily.     Cholecalciferol (VITAMIN D3) 5000 UNITS CAPS Take 2 capsules by mouth daily.     clonazePAM (KLONOPIN) 0.5 MG tablet Take 0.5 tablets (0.25 mg total) by mouth 2 (two) times daily as needed for anxiety. 30 tablet 1   cyclobenzaprine (FLEXERIL) 10 MG tablet Take 1 tablet (10 mg total) by mouth 3 (three) times daily as needed for muscle spasms. 30 tablet 3   diazepam (VALIUM) 5 MG tablet SMARTSIG:1 Tablet(s) Vaginal PRN (Patient not taking: Reported on 01/31/2021)     DIGESTIVE ENZYMES PO Take by mouth daily.     eletriptan (RELPAX) 40 MG tablet Take 1 tablet (40 mg total) by mouth once for 1 dose. One tablet by mouth at onset of headache. May repeat in 2 hours if needed 10 tablet 6   levothyroxine (SYNTHROID) 25 MCG tablet Take 1 tablet (25 mcg total) by mouth daily. 90 tablet 3   magnesium aspartate (MAGINEX) 615 MG tablet Take 615 mg by mouth at bedtime.      Multiple Vitamin (MULTIVITAMIN) tablet Take 1 tablet by mouth daily.     Omega-3 Fatty Acids (OMEGA-3 FISH OIL PO) Take 820 mg by mouth 2 (two) times daily.     promethazine (PHENERGAN) 25 MG tablet Take 1 tablet (25 mg total) by mouth every 8 (eight) hours as needed for nausea or vomiting. 20 tablet 0   sucralfate (CARAFATE) 1 GM/10ML suspension Take 10 mLs (1 g total) by mouth 4 (four) times daily -  with meals and at bedtime. 420 mL 0   Turmeric (QC TUMERIC COMPLEX PO) Take by mouth.     No current facility-administered medications for this visit.    Review of Systems: No chest pain. No shortness of breath. No urinary complaints.    Physical Exam  Wt Readings from Last 3 Encounters:  01/31/21 141 lb (64 kg)  01/16/21 141 lb 12.8 oz (64.3 kg)  03/12/20 142 lb (64.4 kg)    BP 100/60 (BP Location: Left Arm, Patient Position: Sitting, Cuff Size: Normal)   Pulse 72   Ht 5' 4.5" (1.638 m) Comment: height measured without shoes  Wt 144 lb 6 oz (65.5 kg)   BMI  24.40 kg/m  Constitutional:  Generally well appearing female in no acute distress. Psychiatric: Pleasant. Normal mood and affect. Behavior is normal. EENT: Pupils normal.  Conjunctivae  are normal. No scleral icterus. Neck supple.  Cardiovascular: Normal rate, regular rhythm. No edema Pulmonary/chest: Effort normal and breath sounds normal. No wheezing, rales or rhonchi. Abdominal: Soft, nondistended, nontender. Bowel sounds active throughout. There are no masses palpable. No hepatomegaly. Negative Carnett's sign Neurological: Alert and oriented to person place and time. Skin: Skin is warm and dry. No rashes noted.  Tye Savoy, NP  09/18/2021, 8:20 AM  Cc:  Midge Minium, MD

## 2021-09-18 NOTE — Patient Instructions (Addendum)
For now will resume daily Prilosec  20 mg OTC every morning 30 minutes before breakfast for 4 weeks Continue Carafate but increase dose to three times day for 4 weeks.  You takes thyroid medication twice  so this does make it hard to separate meds. Try to take the carafate before lunch, before dinner and at bedtime.  Cautioned about potential for constipation. Can take daily Miralax if needed .  Follow up with me in about 6 weeks. Call in interim in getting worse.  No anti-inflammatory use.   If you are age 62 or older, your body mass index should be between 23-30. Your Body mass index is 24.4 kg/m. If this is out of the aforementioned range listed, please consider follow up with your Primary Care Provider.  If you are age 66 or younger, your body mass index should be between 19-25. Your Body mass index is 24.4 kg/m. If this is out of the aformentioned range listed, please consider follow up with your Primary Care Provider.   ________________________________________________________  The [facility] providers would like to encourage you to use [facility] to communicate with providers for non-urgent requests or questions.  Due to long hold times on the telephone, sending your provider a message by [facility] may be a faster and more efficient way to get a response.  Please allow 48 business hours for a response.  Please remember that this is for non-urgent requests.  _______________________________________________________  Please keep your scheduled follow up.  It was a pleasure to see you today!  Thank you for trusting me with your gastrointestinal care!

## 2021-10-30 ENCOUNTER — Ambulatory Visit: Payer: BC Managed Care – PPO | Admitting: Nurse Practitioner

## 2021-10-30 ENCOUNTER — Encounter: Payer: Self-pay | Admitting: Nurse Practitioner

## 2021-10-30 ENCOUNTER — Other Ambulatory Visit (INDEPENDENT_AMBULATORY_CARE_PROVIDER_SITE_OTHER): Payer: BC Managed Care – PPO

## 2021-10-30 VITALS — BP 100/70 | HR 65 | Ht 64.5 in | Wt 144.5 lb

## 2021-10-30 DIAGNOSIS — R110 Nausea: Secondary | ICD-10-CM

## 2021-10-30 DIAGNOSIS — R1013 Epigastric pain: Secondary | ICD-10-CM

## 2021-10-30 LAB — COMPREHENSIVE METABOLIC PANEL
ALT: 20 U/L (ref 0–35)
AST: 22 U/L (ref 0–37)
Albumin: 4.6 g/dL (ref 3.5–5.2)
Alkaline Phosphatase: 65 U/L (ref 39–117)
BUN: 19 mg/dL (ref 6–23)
CO2: 27 mEq/L (ref 19–32)
Calcium: 9.3 mg/dL (ref 8.4–10.5)
Chloride: 104 mEq/L (ref 96–112)
Creatinine, Ser: 0.83 mg/dL (ref 0.40–1.20)
GFR: 75.69 mL/min (ref >60.00)
Glucose, Bld: 105 mg/dL — ABNORMAL HIGH (ref 70–99)
Potassium: 4.2 mEq/L (ref 3.5–5.1)
Sodium: 139 mEq/L (ref 135–145)
Total Bilirubin: 0.6 mg/dL (ref 0.2–1.2)
Total Protein: 7.6 g/dL (ref 6.0–8.3)

## 2021-10-30 LAB — LIPASE: Lipase: 22 U/L (ref 11.0–59.0)

## 2021-10-30 NOTE — Patient Instructions (Signed)
If you are age 62 or younger, your body mass index should be between 19-25. Your Body mass index is 24.42 kg/m. If this is out of the aformentioned range listed, please consider follow up with your Primary Care Provider.  ________________________________________________________  The [facility] providers would like to encourage you to use [facility] to communicate with providers for non-urgent requests or questions.  Due to long hold times on the telephone, sending your provider a message by [facility] may be a faster and more efficient way to get a response.  Please allow 48 business hours for a response.  Please remember that this is for non-urgent requests.  _______________________________________________________  Your provider has requested that you go to the basement level for lab work before leaving today. Press "B" on the elevator. The lab is located at the first door on the left as you exit the elevator.  You have been scheduled for a CT scan of the abdomen and pelvis at [facility], 1st floor Radiology. You are scheduled on 11/05/2021 at 5:00 pm. You should arrive 30 minutes prior to your appointment time for registration.  We are giving you 2 bottles of contrast today that you will need to drink before arriving for the exam. The solution may taste better if refrigerated so put them in the refrigerator when you get home, but do NOT add ice or any other liquid to this solution as that would dilute it. Shake well before drinking.   Please follow the written instructions below on the day of your exam:   1) Do not eat anything after 1:00 pm (4 hours prior to your test)   2) Drink 1 bottle of contrast @ 3:00 pm (2 hours prior to your exam)  Remember to shake well before drinking and do NOT pour over ice.     Drink 1 bottle of contrast @ 4:00 pm (1 hour prior to your exam)   You may take any medications as prescribed with a small amount of water, if necessary. If you take any of the following  medications: METFORMIN, GLUCOPHAGE, GLUCOVANCE, AVANDAMET, RIOMET, FORTAMET, Guadalupe Guerra MET, JANUMET, GLUMETZA or METAGLIP, you MAY be asked to HOLD this medication 48 hours AFTER the exam.   The purpose of you drinking the oral contrast is to aid in the visualization of your intestinal tract. The contrast solution may cause some diarrhea. Depending on your individual set of symptoms, you may also receive an intravenous injection of x-ray contrast/dye. Plan on being at [facility] for 45 minutes or longer, depending on the type of exam you are having performed.   If you have any questions regarding your exam or if you need to reschedule, you may call [facility] at [phone] between the hours of 8:00 am and 5:00 pm, Monday-Friday.   Follow up pending the results of your CT and labs  Thank you for entrusting me with your care and choosing [facility].  Tye Savoy, NP-C

## 2021-10-30 NOTE — Progress Notes (Signed)
Chief Complaint:  upper abdominal pain    Assessment &  Plan   61 yo female with chronic, intermittent epigastric pain and also nausea. Mild gastroduodenitis on EGD in 2019 for the same pain. Etiology unclear.  She isn't improving on Carafate and PPI. In setting of same symptoms repeat EGD may be low yield. Doesn't sound biliary. Pancreatic source seems unlikely but need to consider. Abdominal wall pain? Functional dyspepsia? Stable weight is reassuring.  Will proceed with CT scan to evaluate persistent pain.  CMET to check renal function and liver tests.  Lipase.  Will contact with results. If negative will consider repeating her EGD.    HPI   Laura Brock is a 61 y.o. female known to Dr. Hilarie Fredrickson with a past medical history significant for gluten sensitivity, migraines, gastritisduodenitis. See PMH /PSH for additional history   Laura Brock has chronic intermittent epigastric discomfort was last seen late last month for evaluation of epigastric discomfort previously followed by Dr. Harl Favor in [facility] followed by Dr. Paulita Fujita with [facility]. She established care with Dr. Hilarie Fredrickson in 2019. Subsequent EGD remarkable for mild gastroduodenitis. Reactive gastropathy on gastric biopsies. No H. Pylori.   I saw her in June 2023 for recurrent epigastric pain since February. She had started Carafate and Prilosec and was already feeling better. Plan was to continue that regimen and follow up in a few weeks.   Interval History:  Laura Brock is here with her husband. She has been taking Carafate TID and also daily PPI but isn't feeling any better. She has ongoing episodes of non-radiating, "achy" epigastric discomfort and also some nausea. She has several episodes a week , sometimes lasting hours  The episodes are not consistently affected by eating. Sometimes it feels better to eat, other times she gets discomfort after eating. The discomfort related to physical activity / position. She rarely takes NSAIDS.  Her  weight has been stable. Her bowel movements are normal. She is starting to worry that something bad is going on.    Previous GI Evaluation   Oct 2019 EGD for epigastric pain - Normal esophagus. - Mild gastritis. Biopsied. - Mild bulbar duodenitis. - Normal second portion of the duodenum. Biopsies c/w reactive gastropathy.     Nov 2022 screening colonoscopy --6  mm polyp.  Path compatible with colonic mucosa   Labs:     Latest Ref Rng & Units 01/16/2021    8:18 AM 01/16/2020    8:33 AM 01/12/2019    8:42 AM  CBC  WBC 4.0 - 10.5 K/uL 4.1  4.4  3.6   Hemoglobin 12.0 - 15.0 g/dL 12.9  12.7  13.0   Hematocrit 36.0 - 46.0 % 38.3  37.9  38.2   Platelets 150.0 - 400.0 K/uL 178.0  188.0  170.0        Latest Ref Rng & Units 01/16/2021    8:18 AM 01/16/2020    8:33 AM 01/12/2019    8:42 AM  Hepatic Function  Total Protein 6.0 - 8.3 g/dL 6.7  7.1  7.0   Albumin 3.5 - 5.2 g/dL 4.3  4.5  4.6   AST 0 - 37 U/L 22  21  22    ALT 0 - 35 U/L 17  17  21    Alk Phosphatase 39 - 117 U/L 55  62  54   Total Bilirubin 0.2 - 1.2 mg/dL 0.6  0.6  0.7   Bilirubin, Direct 0.0 - 0.3 mg/dL 0.1  0.1  0.1  Past Medical History:  Diagnosis Date   Allergy    Anxiety    Arthritis    COVID-19    Gastritis    GERD (gastroesophageal reflux disease)    History of anal fissures    Hypothyroidism 07/2010   Internal hemorrhoids    Migraine    Non-celiac gluten sensitivity    Palpitation     Past Surgical History:  Procedure Laterality Date   WISDOM TOOTH EXTRACTION      Current Medications, Allergies, Family History and Social History were reviewed in [employer] record.     Current Outpatient Medications  Medication Sig Dispense Refill   b complex vitamins capsule Take 1 capsule by mouth daily.     Cholecalciferol (VITAMIN D3) 5000 UNITS CAPS Take 2 capsules by mouth daily.     clonazePAM (KLONOPIN) 0.5 MG tablet Take 0.5 tablets (0.25 mg total) by mouth 2 (two)  times daily as needed for anxiety. 30 tablet 1   cyclobenzaprine (FLEXERIL) 10 MG tablet Take 1 tablet (10 mg total) by mouth 3 (three) times daily as needed for muscle spasms. 30 tablet 3   diazepam (VALIUM) 5 MG tablet      eletriptan (RELPAX) 40 MG tablet Take 1 tablet (40 mg total) by mouth once for 1 dose. One tablet by mouth at onset of headache. May repeat in 2 hours if needed 10 tablet 6   magnesium aspartate (MAGINEX) 615 MG tablet Take 615 mg by mouth at bedtime.      Menaquinone-7 (VITAMIN K2) 100 MCG CAPS Take 1 tablet by mouth daily.     Multiple Vitamin (MULTIVITAMIN) tablet Take 1 tablet by mouth daily.     Omega-3 Fatty Acids (OMEGA-3 FISH OIL PO) Take 820 mg by mouth 2 (two) times daily.     promethazine (PHENERGAN) 25 MG tablet Take 1 tablet (25 mg total) by mouth every 8 (eight) hours as needed for nausea or vomiting. 20 tablet 0   sucralfate (CARAFATE) 1 g tablet Take 1 tablet (1 g total) by mouth in the morning, at noon, and at bedtime. 90 tablet 1   Turmeric (QC TUMERIC COMPLEX PO) Take by mouth.     vitamin C (ASCORBIC ACID) 500 MG tablet Take 1 tablet by mouth daily.     No current facility-administered medications for this visit.    Review of Systems: No chest pain. No shortness of breath. No urinary complaints.    Physical Exam  Wt Readings from Last 3 Encounters:  09/18/21 144 lb 6 oz (65.5 kg)  01/31/21 141 lb (64 kg)  01/16/21 141 lb 12.8 oz (64.3 kg)    BP 100/70   Pulse 65   Ht 5' 4.5" (1.638 m)   Wt 144 lb 8 oz (65.5 kg)   SpO2 99%   BMI 24.42 kg/m  Constitutional:  Generally well appearing female in no acute distress. Psychiatric: Pleasant. Normal mood and affect. Behavior is normal. EENT: Pupils normal.  Conjunctivae are normal. No scleral icterus. Neck supple.  Cardiovascular: Normal rate, regular rhythm. No edema Pulmonary/chest: Effort normal and breath sounds normal. No wheezing, rales or rhonchi. Abdominal: Soft, nondistended, nontender.  Bowel sounds active throughout. There are no masses palpable. No hepatomegaly. Neurological: Alert and oriented to person place and time. Skin: Skin is warm and dry. No rashes noted.  Tye Savoy, NP  10/30/2021, 8:29 AM

## 2021-11-04 NOTE — Progress Notes (Signed)
Addendum: Reviewed and agree with assessment and management plan. Rei Medlen M, MD  

## 2021-11-05 ENCOUNTER — Ambulatory Visit (HOSPITAL_COMMUNITY)
Admission: RE | Admit: 2021-11-05 | Discharge: 2021-11-05 | Disposition: A | Discharge status: Home / Self Care | Payer: BC Managed Care – PPO | Source: Ambulatory Visit | Attending: Nurse Practitioner | Admitting: Nurse Practitioner

## 2021-11-05 DIAGNOSIS — R1013 Epigastric pain: Secondary | ICD-10-CM | POA: Diagnosis present

## 2021-11-05 DIAGNOSIS — R110 Nausea: Secondary | ICD-10-CM | POA: Insufficient documentation

## 2021-11-05 MED ORDER — IOHEXOL 300 MG/ML  SOLN
100.0000 mL | Freq: Once | INTRAMUSCULAR | Status: AC | PRN
  Administered 2021-11-05: 100 mL via INTRAVENOUS

## 2021-11-05 MED ORDER — IOHEXOL 9 MG/ML PO SOLN
1000.0000 mL | Freq: Once | ORAL | Status: AC
  Administered 2021-11-05: 1000 mL via ORAL

## 2021-11-11 NOTE — Telephone Encounter (Signed)
Spoke with pt and let pt know that Nevin Bloodgood is out of the office today but that I would send message to [place] and she will respond when she returns. Also answered pt's questions about miralax purge and pt stated she would complete purge today.

## 2021-11-19 NOTE — Telephone Encounter (Signed)
Left message for pt to call back  °

## 2021-11-21 NOTE — Telephone Encounter (Signed)
Spoke with pt and gave her recommendations. Pt verbalized understanding and requested to call back early next week to schedule EGD.

## 2021-11-24 ENCOUNTER — Other Ambulatory Visit: Payer: Self-pay

## 2021-11-24 ENCOUNTER — Telehealth: Payer: Self-pay | Admitting: Nurse Practitioner

## 2021-11-24 DIAGNOSIS — R1010 Upper abdominal pain, unspecified: Secondary | ICD-10-CM

## 2021-11-24 DIAGNOSIS — R1013 Epigastric pain: Secondary | ICD-10-CM

## 2021-11-24 NOTE — Telephone Encounter (Signed)
Instructions sent to [facility] and mailed. Ambulatory referral to GI placed.

## 2021-11-24 NOTE — Telephone Encounter (Signed)
Per instructions, patient called today and scheduled EGD with Dr. Hilarie Fredrickson.  The date of the procedure is 12/29/21 at 10:00 a.m.  Patient will need instructions and said it was OK to send them to her My Chart.  Thank you.

## 2021-12-29 ENCOUNTER — Ambulatory Visit (AMBULATORY_SURGERY_CENTER): Payer: BC Managed Care – PPO | Admitting: Internal Medicine

## 2021-12-29 ENCOUNTER — Encounter: Payer: Self-pay | Admitting: Internal Medicine

## 2021-12-29 VITALS — BP 117/60 | HR 51 | Temp 97.1°F | Resp 12 | Ht 64.0 in | Wt 144.0 lb

## 2021-12-29 DIAGNOSIS — K2960 Other gastritis without bleeding: Secondary | ICD-10-CM

## 2021-12-29 DIAGNOSIS — K2950 Unspecified chronic gastritis without bleeding: Secondary | ICD-10-CM | POA: Diagnosis not present

## 2021-12-29 DIAGNOSIS — R110 Nausea: Secondary | ICD-10-CM

## 2021-12-29 DIAGNOSIS — R1010 Upper abdominal pain, unspecified: Secondary | ICD-10-CM

## 2021-12-29 DIAGNOSIS — R1013 Epigastric pain: Secondary | ICD-10-CM | POA: Diagnosis not present

## 2021-12-29 MED ORDER — SODIUM CHLORIDE 0.9 % IV SOLN: 500.0000 mL | INTRAVENOUS | Status: DC

## 2021-12-29 NOTE — Progress Notes (Signed)
GASTROENTEROLOGY PROCEDURE H&P NOTE   Primary Care Physician: Midge Minium, MD    Reason for Procedure:  Epigastric pain  Plan:    Upper endoscopy  Patient is appropriate for endoscopic procedure(s) in the ambulatory ([place]) setting.  The nature of the procedure, as well as the risks, benefits, and alternatives were carefully and thoroughly reviewed with the patient. Ample time for discussion and questions allowed. The patient understood, was satisfied, and agreed to proceed.     HPI: Laura Brock is a 61 y.o. female who presents for upper endoscopy.  Medical history as below.  No recent chest pain or shortness of breath.  No abdominal pain today.  Past Medical History:  Diagnosis Date   Allergy    Anxiety    Arthritis    COVID-19    Gastritis    GERD (gastroesophageal reflux disease)    History of anal fissures    Hypothyroidism 07/2010   Internal hemorrhoids    Migraine    Non-celiac gluten sensitivity    Palpitation     Past Surgical History:  Procedure Laterality Date   WISDOM TOOTH EXTRACTION      Prior to Admission medications   Medication Sig Start Date End Date Taking? Authorizing Provider  b complex vitamins capsule Take 1 capsule by mouth daily.   Yes [provider]  Cholecalciferol (VITAMIN D3) 5000 UNITS CAPS Take 2 capsules by mouth daily.   Yes [provider]  Levothyroxine Sodium (SYNTHROID PO) Take 60 mcg by mouth in the morning and at bedtime.   Yes [provider]  Multiple Vitamin (MULTIVITAMIN) tablet Take 1 tablet by mouth daily.   Yes [provider]  clonazePAM (KLONOPIN) 0.5 MG tablet Take 0.5 tablets (0.25 mg total) by mouth 2 (two) times daily as needed for anxiety. 01/16/21   Midge Minium, MD  cyclobenzaprine (FLEXERIL) 10 MG tablet Take 1 tablet (10 mg total) by mouth 3 (three) times daily as needed for muscle spasms. 01/16/21   Midge Minium, MD  diazepam (VALIUM) 5 MG tablet   01/09/21   [provider]  eletriptan (RELPAX) 40 MG tablet Take 1 tablet (40 mg total) by mouth once for 1 dose. One tablet by mouth at onset of headache. May repeat in 2 hours if needed 01/16/21 09/18/21  Midge Minium, MD  magnesium aspartate (MAGINEX) 615 MG tablet Take 615 mg by mouth at bedtime.  Patient not taking: Reported on 10/30/2021    [provider]  Menaquinone-7 (VITAMIN K2) 100 MCG CAPS Take 1 tablet by mouth daily. Patient not taking: Reported on 10/30/2021 03/31/1919   [provider]  Omega-3 Fatty Acids (OMEGA-3 FISH OIL PO) Take 820 mg by mouth 2 (two) times daily. Patient not taking: Reported on 10/30/2021    [provider]  promethazine (PHENERGAN) 25 MG tablet Take 1 tablet (25 mg total) by mouth every 8 (eight) hours as needed for nausea or vomiting. 01/22/21   Midge Minium, MD  sucralfate (CARAFATE) 1 g tablet Take 1 tablet (1 g total) by mouth in the morning, at noon, and at bedtime. 09/18/21   Willia Craze, NP  Turmeric (QC TUMERIC COMPLEX PO) Take by mouth. Patient not taking: Reported on 10/30/2021    [provider]  vitamin C (ASCORBIC ACID) 500 MG tablet Take 1 tablet by mouth daily. Patient not taking: Reported on 10/30/2021 04/10/21   [provider]    Current Outpatient Medications  Medication  Sig Dispense Refill   b complex vitamins capsule Take 1 capsule by mouth daily.     Cholecalciferol (VITAMIN D3) 5000 UNITS CAPS Take 2 capsules by mouth daily.     Levothyroxine Sodium (SYNTHROID PO) Take 60 mcg by mouth in the morning and at bedtime.     Multiple Vitamin (MULTIVITAMIN) tablet Take 1 tablet by mouth daily.     clonazePAM (KLONOPIN) 0.5 MG tablet Take 0.5 tablets (0.25 mg total) by mouth 2 (two) times daily as needed for anxiety. 30 tablet 1   cyclobenzaprine (FLEXERIL) 10 MG tablet Take 1 tablet (10 mg total) by mouth 3 (three) times daily as needed for muscle spasms. 30 tablet 3   diazepam  (VALIUM) 5 MG tablet      eletriptan (RELPAX) 40 MG tablet Take 1 tablet (40 mg total) by mouth once for 1 dose. One tablet by mouth at onset of headache. May repeat in 2 hours if needed 10 tablet 6   magnesium aspartate (MAGINEX) 615 MG tablet Take 615 mg by mouth at bedtime.  (Patient not taking: Reported on 10/30/2021)     Menaquinone-7 (VITAMIN K2) 100 MCG CAPS Take 1 tablet by mouth daily. (Patient not taking: Reported on 10/30/2021)     Omega-3 Fatty Acids (OMEGA-3 FISH OIL PO) Take 820 mg by mouth 2 (two) times daily. (Patient not taking: Reported on 10/30/2021)     promethazine (PHENERGAN) 25 MG tablet Take 1 tablet (25 mg total) by mouth every 8 (eight) hours as needed for nausea or vomiting. 20 tablet 0   sucralfate (CARAFATE) 1 g tablet Take 1 tablet (1 g total) by mouth in the morning, at noon, and at bedtime. 90 tablet 1   Turmeric (QC TUMERIC COMPLEX PO) Take by mouth. (Patient not taking: Reported on 10/30/2021)     vitamin C (ASCORBIC ACID) 500 MG tablet Take 1 tablet by mouth daily. (Patient not taking: Reported on 10/30/2021)     Current Facility-Administered Medications  Medication Dose Route Frequency Provider Last Rate Last Admin   0.9 %  sodium chloride infusion  500 mL Intravenous Continuous Lyndzie Zentz, Lajuan Lines, MD        Allergies as of 12/29/2021 - Review Complete 12/29/2021  Allergen Reaction Noted   Latex Rash    Penicillins Swelling 09/28/2008   Zofran [ondansetron hcl]  02/18/2015   Dairy aid [tilactase]  12/14/2017   Gluten meal  12/14/2017    Family History  Problem Relation Age of Onset   Thyroid disease Mother    Colon polyps Father    Liver cancer Father    Colon polyps Sister    Diabetes Maternal Grandfather    Heart disease Paternal Grandfather    Liver disease Neg Hx    Colon cancer Neg Hx    Esophageal cancer Neg Hx    Rectal cancer Neg Hx    Stomach cancer Neg Hx     Social History   Socioeconomic History   Marital status: Married    Spouse name: Not  on file   Number of children: 0   Years of education: Not on file   Highest education level: Not on file  Occupational History   Occupation: Client specialist  Tobacco Use   Smoking status: Never   Smokeless tobacco: Never  Vaping Use   Vaping Use: Never used  Substance and Sexual Activity   Alcohol use: Yes    Comment: social   Drug use: No   Sexual activity: Not on file  Other Topics Concern   Not on file  Social History Narrative   Not on file   Social Determinants of Health   Financial Resource Strain: Not on file  Food Insecurity: Not on file  Transportation Needs: Not on file  Physical Activity: Not on file  Stress: Not on file  Social Connections: Not on file  Intimate Partner Violence: Not on file    Physical Exam: Vital signs in last 24 hours: '@BP'$  105/61   Pulse 68   Temp (!) 97.1 F (36.2 C) (Temporal)   Ht '5\' 4"'$  (1.626 m)   Wt 144 lb (65.3 kg)   SpO2 100%   BMI 24.72 kg/m  GEN: NAD EYE: Sclerae anicteric ENT: MMM CV: Non-tachycardic Pulm: CTA b/l GI: Soft, NT/ND NEURO:  Alert & Oriented x 3   Zenovia Jarred, MD [facility]  12/29/2021 9:30 AM

## 2021-12-29 NOTE — Patient Instructions (Signed)
Await pathology results Return to normal activities tomorrow, continue present medications, resume your previous diet   YOU HAD AN ENDOSCOPIC PROCEDURE TODAY AT [facility]:   Refer to the procedure report that was given to you for any specific questions about what was found during the examination.  If the procedure report does not answer your questions, please call your gastroenterologist to clarify.  If you requested that your care partner not be given the details of your procedure findings, then the procedure report has been included in a sealed envelope for you to review at your convenience later.  YOU SHOULD EXPECT: Some feelings of bloating in the abdomen. Passage of more gas than usual.  Walking can help get rid of the air that was put into your GI tract during the procedure and reduce the bloating. If you had a lower endoscopy (such as a colonoscopy or flexible sigmoidoscopy) you may notice spotting of blood in your stool or on the toilet paper. If you underwent a bowel prep for your procedure, you may not have a normal bowel movement for a few days.  Please Note:  You might notice some irritation and congestion in your nose or some drainage.  This is from the oxygen used during your procedure.  There is no need for concern and it should clear up in a day or so.  SYMPTOMS TO REPORT IMMEDIATELY:  Following upper endoscopy (EGD)  Vomiting of blood or coffee ground material  New chest pain or pain under the shoulder blades  Painful or persistently difficult swallowing  New shortness of breath  Fever of 100F or higher  Black, tarry-looking stools  For urgent or emergent issues, a gastroenterologist can be reached at any hour by calling [phone]. Do not use MyChart messaging for urgent concerns.    DIET:  We do recommend a small meal at first, but then you may proceed to your regular diet.  Drink plenty of fluids but you should avoid alcoholic beverages for 24  hours.  ACTIVITY:  You should plan to take it easy for the rest of today and you should NOT DRIVE or use heavy machinery until tomorrow (because of the sedation medicines used during the test).    FOLLOW UP: Our staff will call the number listed on your records the next business day following your procedure.  We will call around 7:15- 8:00 am to check on you and address any questions or concerns that you may have regarding the information given to you following your procedure. If we do not reach you, we will leave a message.     If any biopsies were taken you will be contacted by phone or by letter within the next 1-3 weeks.  Please call us at [phone] if you have not heard about the biopsies in 3 weeks.    SIGNATURES/CONFIDENTIALITY: You and/or your care partner have signed paperwork which will be entered into your electronic medical record.  These signatures attest to the fact that that the information above on your After Visit Summary has been reviewed and is understood.  Full responsibility of the confidentiality of this discharge information lies with you and/or your care-partner.

## 2021-12-29 NOTE — Progress Notes (Signed)
To PACU, VSS. Report to [email or web address] 

## 2021-12-29 NOTE — Op Note (Signed)
[facility] Patient Name: Laura Brock Procedure Date: 12/29/2021 9:13 AM MRN: [id number] Endoscopist: Jerene Bears , MD Age: 61 Referring MD:  Date of Birth: Aug 26, 1960 Gender: Female Account #: [id number] Procedure:                Upper GI endoscopy Indications:              Epigastric abdominal pain, no response to PPI or                            Carafate, reassuring CT abd/pelvis Medicines:                Monitored Anesthesia Care Procedure:                Pre-Anesthesia Assessment:                           - Prior to the procedure, a History and Physical                            was performed, and patient medications and                            allergies were reviewed. The patient's tolerance of                            previous anesthesia was also reviewed. The risks                            and benefits of the procedure and the sedation                            options and risks were discussed with the patient.                            All questions were answered, and informed consent                            was obtained. Prior Anticoagulants: The patient has                            taken no previous anticoagulant or antiplatelet                            agents. ASA Grade Assessment: II - A patient with                            mild systemic disease. After reviewing the risks                            and benefits, the patient was deemed in                            satisfactory condition to undergo the procedure.  After obtaining informed consent, the endoscope was                            passed under direct vision. Throughout the                            procedure, the patient's blood pressure, pulse, and                            oxygen saturations were monitored continuously. The                            Endoscope was introduced through the mouth, and                            advanced to the second part  of duodenum. Scope In: Scope Out: Findings:                 The examined esophagus was normal.                           The entire examined stomach was normal. Biopsies                            were taken with a cold forceps for histology and                            Helicobacter pylori testing.                           The examined duodenum was normal. Biopsies for                            histology were taken with a cold forceps for                            evaluation of celiac disease. Complications:            No immediate complications. Estimated Blood Loss:     Estimated blood loss was minimal. Impression:               - Normal esophagus.                           - Normal stomach. Biopsied.                           - Normal examined duodenum. Biopsied. Recommendation:           - Patient has a contact number available for                            emergencies. The signs and symptoms of potential                            delayed complications were discussed with the  patient. Return to normal activities tomorrow.                            Written discharge instructions were provided to the                            patient.                           - Resume previous diet.                           - Continue present medications.                           - Await pathology results. 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 Jerene Bears, MD 12/29/2021 9:54:19 AM This report has been signed electronically.

## 2021-12-29 NOTE — Progress Notes (Signed)
Called to room to assist during endoscopic procedure.  Patient ID and intended procedure confirmed with present staff. Received instructions for my participation in the procedure from the performing physician.  

## 2021-12-30 ENCOUNTER — Telehealth: Payer: Self-pay | Admitting: *Deleted

## 2021-12-30 NOTE — Telephone Encounter (Signed)
  Follow up Call-    Row Labels 12/29/2021    9:10 AM 01/31/2021    8:12 AM  Call back number   Section Header. No data exists in this row.    Post procedure Call Back phone  #   [phone]  Permission to leave phone message   Yes Yes     Patient questions:  Do you have a fever, pain , or abdominal swelling? No. Pain Score  0 *  Have you tolerated food without any problems? Yes.    Have you been able to return to your normal activities? Yes.    Do you have any questions about your discharge instructions: Diet   No. Medications  No. Follow up visit  No.  Do you have questions or concerns about your Care? No.  Actions: * If pain score is 4 or above: No action needed, pain <4.

## 2022-01-01 ENCOUNTER — Encounter: Payer: Self-pay | Admitting: Internal Medicine

## 2022-01-19 ENCOUNTER — Ambulatory Visit (INDEPENDENT_AMBULATORY_CARE_PROVIDER_SITE_OTHER): Payer: BC Managed Care – PPO | Admitting: Family Medicine

## 2022-01-19 ENCOUNTER — Encounter: Payer: Self-pay | Admitting: Family Medicine

## 2022-01-19 VITALS — BP 118/60 | HR 72 | Temp 97.6°F | Ht 64.5 in | Wt 141.0 lb

## 2022-01-19 DIAGNOSIS — E038 Other specified hypothyroidism: Secondary | ICD-10-CM

## 2022-01-19 DIAGNOSIS — Z0000 Encounter for general adult medical examination without abnormal findings: Secondary | ICD-10-CM

## 2022-01-19 DIAGNOSIS — M8580 Other specified disorders of bone density and structure, unspecified site: Secondary | ICD-10-CM | POA: Diagnosis not present

## 2022-01-19 LAB — CBC WITH DIFFERENTIAL/PLATELET
Basophils Absolute: 0 10*3/uL (ref 0.0–0.1)
Basophils Relative: 0.8 % (ref 0.0–3.0)
Eosinophils Absolute: 0.1 10*3/uL (ref 0.0–0.7)
Eosinophils Relative: 2.7 % (ref 0.0–5.0)
HCT: 39.1 % (ref 36.0–46.0)
Hemoglobin: 13.3 g/dL (ref 12.0–15.0)
Lymphocytes Relative: 32.7 % (ref 12.0–46.0)
Lymphs Abs: 1.5 10*3/uL (ref 0.7–4.0)
MCHC: 33.9 g/dL (ref 30.0–36.0)
MCV: 93.4 fl (ref 78.0–100.0)
Monocytes Absolute: 0.5 10*3/uL (ref 0.1–1.0)
Monocytes Relative: 11.4 % (ref 3.0–12.0)
Neutro Abs: 2.3 10*3/uL (ref 1.4–7.7)
Neutrophils Relative %: 52.4 % (ref 43.0–77.0)
Platelets: 193 10*3/uL (ref 150.0–400.0)
RBC: 4.19 Mil/uL (ref 3.87–5.11)
RDW: 12.9 % (ref 11.5–15.5)
WBC: 4.5 10*3/uL (ref 4.0–10.5)

## 2022-01-19 LAB — BASIC METABOLIC PANEL
BUN: 14 mg/dL (ref 6–23)
CO2: 27 mEq/L (ref 19–32)
Calcium: 9.3 mg/dL (ref 8.4–10.5)
Chloride: 106 mEq/L (ref 96–112)
Creatinine, Ser: 0.71 mg/dL (ref 0.40–1.20)
GFR: 91.15 mL/min (ref >60.00)
Glucose, Bld: 99 mg/dL (ref 70–99)
Potassium: 5 mEq/L (ref 3.5–5.1)
Sodium: 138 mEq/L (ref 135–145)

## 2022-01-19 LAB — T4, FREE: Free T4: 0.66 ng/dL (ref 0.60–1.60)

## 2022-01-19 LAB — HEPATIC FUNCTION PANEL
ALT: 18 U/L (ref 0–35)
AST: 23 U/L (ref 0–37)
Albumin: 4.4 g/dL (ref 3.5–5.2)
Alkaline Phosphatase: 60 U/L (ref 39–117)
Bilirubin, Direct: 0.1 mg/dL (ref 0.0–0.3)
Total Bilirubin: 0.5 mg/dL (ref 0.2–1.2)
Total Protein: 7.1 g/dL (ref 6.0–8.3)

## 2022-01-19 LAB — T3, FREE: T3, Free: 2.8 pg/mL (ref 2.3–4.2)

## 2022-01-19 LAB — LIPID PANEL
Cholesterol: 161 mg/dL (ref 0–200)
HDL: 53.1 mg/dL (ref >39.00)
LDL Cholesterol: 99 mg/dL (ref 0–99)
NonHDL: 108.16
Total CHOL/HDL Ratio: 3
Triglycerides: 48 mg/dL (ref 0.0–149.0)
VLDL: 9.6 mg/dL (ref 0.0–40.0)

## 2022-01-19 LAB — VITAMIN D 25 HYDROXY (VIT D DEFICIENCY, FRACTURES): VITD: 73.86 ng/mL (ref 30.00–100.00)

## 2022-01-19 LAB — TSH: TSH: 0.31 u[IU]/mL — ABNORMAL LOW (ref 0.35–5.50)

## 2022-01-19 MED ORDER — CLONAZEPAM 0.5 MG PO TABS
0.2500 mg | ORAL_TABLET | Freq: Two times a day (BID) | ORAL | 1 refills | Status: DC | PRN
Start: 2022-01-19 — End: 2023-03-09

## 2022-01-19 MED ORDER — CYCLOBENZAPRINE HCL 10 MG PO TABS: 10.0000 mg | ORAL_TABLET | Freq: Three times a day (TID) | ORAL | 3 refills | Status: DC | PRN

## 2022-01-19 MED ORDER — ELETRIPTAN HYDROBROMIDE 40 MG PO TABS: 40.0000 mg | ORAL_TABLET | Freq: Once | ORAL | 6 refills | Status: DC

## 2022-01-19 NOTE — Progress Notes (Signed)
   Subjective:    Patient ID: Laura Brock, female    DOB: [birth year], 61 y.o.   MRN: [id number]  HPI CPE- UTD on DEXA, Tdap, mammo, colonoscopy.  Due for pap but pt has vaginal atrophy and last pap was 'awful'  Patient Care Team    Relationship Specialty Notifications Start End  Midge Minium, MD PCP - General Family Medicine  07/04/14   Lollie Sails, MD Referring Physician Internal Medicine  07/03/14   Talbert Forest, [place] Physician Chiropractic Medicine  07/04/14   Tiajuana Amass, MD Referring Physician Allergy and Immunology  07/04/14   Hale Bogus., MD Referring Physician Gastroenterology  07/04/14   Dian Queen, MD Consulting Physician Obstetrics and Gynecology  01/03/15      Health Maintenance  Topic Date Due   Zoster Vaccines- Shingrix (1 of 2) Never done   PAP SMEAR-Modifier  01/19/2022 (Originally 06/11/2021)   COVID-19 Vaccine (1) 02/04/2022 (Originally 03/04/1961)   INFLUENZA VACCINE  06/28/2022 (Originally 10/28/2021)   Hepatitis C Screening  01/20/2023 (Originally 09/02/1977)   HIV Screening  01/20/2023 (Originally 09/03/1974)   DEXA SCAN  02/04/2022   MAMMOGRAM  04/11/2022   TETANUS/TDAP  01/06/2026   COLONOSCOPY (Pts 45-38yr Insurance coverage will need to be confirmed)  02/01/2031   HPV VACCINES  Aged Out      Review of Systems Patient reports no vision/ hearing changes, adenopathy,fever, weight change,  persistant/recurrent hoarseness , swallowing issues, chest pain, palpitations, edema, persistant/recurrent cough, hemoptysis, dyspnea (rest/exertional/paroxysmal nocturnal), gastrointestinal bleeding (melena, rectal bleeding), significant heartburn, bowel changes, GU symptoms (dysuria, hematuria, incontinence), Gyn symptoms (abnormal  bleeding, pain),  syncope, focal weakness, memory loss, numbness & tingling, skin/hair/nail changes, abnormal bruising or bleeding, anxiety, or depression.   + abd pain- gastritis    Objective:   Physical Exam General  Appearance:    Alert, cooperative, no distress, appears stated age  Head:    Normocephalic, without obvious abnormality, atraumatic  Eyes:    PERRL, conjunctiva/corneas clear, EOM's intact both eyes  Ears:    Normal TM's and external ear canals, both ears  Nose:   Nares normal, septum midline, mucosa normal, no drainage    or sinus tenderness  Throat:   Lips, mucosa, and tongue normal; teeth and gums normal  Neck:   Supple, symmetrical, trachea midline, no adenopathy;    Thyroid: no enlargement/tenderness/nodules  Back:     Symmetric, no curvature, ROM normal, no CVA tenderness  Lungs:     Clear to auscultation bilaterally, respirations unlabored  Chest Wall:    No tenderness or deformity   Heart:    Regular rate and rhythm, S1 and S2 normal, no murmur, rub   or gallop  Breast Exam:    Deferred to GYN  Abdomen:     Soft, non-tender, bowel sounds active all four quadrants,    no masses, no organomegaly  Genitalia:    Deferred to GYN  Rectal:    Extremities:   Extremities normal, atraumatic, no cyanosis or edema  Pulses:   2+ and symmetric all extremities  Skin:   Skin color, texture, turgor normal, no rashes or lesions  Lymph nodes:   Cervical, supraclavicular, and axillary nodes normal  Neurologic:   CNII-XII intact, normal strength, sensation and reflexes    throughout          Assessment & Plan:

## 2022-01-19 NOTE — Assessment & Plan Note (Signed)
Check labs and adjust meds prn. 

## 2022-01-19 NOTE — Assessment & Plan Note (Signed)
Pt's PE WNL.  UTD on colonoscopy, mammo, tdap.  Declines flu.  Is in discussions w/ GYN regarding pap.  Check labs.  Anticipatory guidance provided.

## 2022-01-19 NOTE — Patient Instructions (Signed)
Follow up in 1 year or as needed We'll notify you of your lab results and make any changes if needed Keep up the good work on healthy diet and regular exercise- you look great!!! Call with any questions or concerns Stay Safe!  Stay Healthy! Happy Fall!!!

## 2022-01-19 NOTE — Assessment & Plan Note (Signed)
Check Vit D level and replete prn. 

## 2022-01-19 NOTE — Progress Notes (Signed)
Left pt a detailed VM in regard to lab results

## 2022-03-10 ENCOUNTER — Ambulatory Visit: Payer: BC Managed Care – PPO | Admitting: Internal Medicine

## 2022-03-24 ENCOUNTER — Encounter: Payer: Self-pay | Admitting: Family Medicine

## 2022-04-24 LAB — HM MAMMOGRAPHY

## 2022-05-26 ENCOUNTER — Encounter: Payer: Self-pay | Admitting: Family Medicine

## 2022-05-28 ENCOUNTER — Encounter: Payer: Self-pay | Admitting: Family Medicine

## 2022-05-28 LAB — HM PAP SMEAR

## 2023-03-09 ENCOUNTER — Ambulatory Visit (INDEPENDENT_AMBULATORY_CARE_PROVIDER_SITE_OTHER): Payer: BC Managed Care – PPO | Admitting: Family Medicine

## 2023-03-09 ENCOUNTER — Encounter: Payer: Self-pay | Admitting: Family Medicine

## 2023-03-09 VITALS — BP 120/74 | HR 68 | Temp 98.7°F | Ht 65.0 in | Wt 140.5 lb

## 2023-03-09 DIAGNOSIS — M8580 Other specified disorders of bone density and structure, unspecified site: Secondary | ICD-10-CM

## 2023-03-09 DIAGNOSIS — E038 Other specified hypothyroidism: Secondary | ICD-10-CM | POA: Diagnosis not present

## 2023-03-09 DIAGNOSIS — Z0000 Encounter for general adult medical examination without abnormal findings: Secondary | ICD-10-CM

## 2023-03-09 MED ORDER — CLONAZEPAM 0.5 MG PO TABS: 0.2500 mg | ORAL_TABLET | Freq: Two times a day (BID) | ORAL | 1 refills | Status: DC | PRN

## 2023-03-09 MED ORDER — PROMETHAZINE HCL 25 MG PO TABS
25.0000 mg | ORAL_TABLET | Freq: Four times a day (QID) | ORAL | 1 refills | Status: AC | PRN
Start: 2023-03-09 — End: ?

## 2023-03-09 MED ORDER — ELETRIPTAN HYDROBROMIDE 40 MG PO TABS: 40.0000 mg | ORAL_TABLET | Freq: Once | ORAL | 6 refills | Status: DC

## 2023-03-09 MED ORDER — CYCLOBENZAPRINE HCL 10 MG PO TABS: 10.0000 mg | ORAL_TABLET | Freq: Three times a day (TID) | ORAL | 3 refills | Status: DC | PRN

## 2023-03-09 NOTE — Progress Notes (Signed)
   Subjective:    Patient ID: Laura Brock, female    DOB: 11-14-1960, 62 y.o.   MRN: [id number]  HPI CPE- due for DEXA.  UTD on pap, mammo, colonoscopy, Tdap  Patient Care Team    Relationship Specialty Notifications Start End  Sheliah Hatch, MD PCP - General Family Medicine  07/04/14   Mia Creek, MD Referring Physician Internal Medicine  07/03/14   Jacklynn Bue, DC Consulting Physician Chiropractic Medicine  07/04/14   Eileen Stanford, MD Referring Physician Allergy and Immunology  07/04/14   Sydnee Cabal., MD Referring Physician Gastroenterology  07/04/14   Marcelle Overlie, MD Consulting Physician Obstetrics and Gynecology  01/03/15      Health Maintenance  Topic Date Due   HIV Screening  Never done   Hepatitis C Screening  Never done   Zoster Vaccines- Shingrix (1 of 2) Never done   DEXA SCAN  02/04/2022   INFLUENZA VACCINE  Never done   COVID-19 Vaccine (1 - 2023-24 season) Never done   MAMMOGRAM  04/25/2023   Cervical Cancer Screening (HPV/Pap Cotest)  05/27/2025   DTaP/Tdap/Td (2 - Td or Tdap) 01/06/2026   Colonoscopy  02/01/2031   HPV VACCINES  Aged Out     Review of Systems Patient reports no vision/ hearing changes, adenopathy,fever, weight change,  persistant/recurrent hoarseness , swallowing issues, chest pain, palpitations, edema, persistant/recurrent cough, hemoptysis, dyspnea (rest/exertional/paroxysmal nocturnal), gastrointestinal bleeding (melena, rectal bleeding), abdominal pain, significant heartburn, bowel changes, GU symptoms (dysuria, hematuria, incontinence), Gyn symptoms (abnormal  bleeding, pain),  syncope, focal weakness, memory loss, numbness & tingling, skin/hair/nail changes, abnormal bruising or bleeding, anxiety, or depression.     Objective:   Physical Exam General Appearance:    Alert, cooperative, no distress, appears stated age  Head:    Normocephalic, without obvious abnormality, atraumatic  Eyes:    PERRL, conjunctiva/corneas clear,  EOM's intact both eyes  Ears:    Normal TM's and external ear canals, both ears  Nose:   Nares normal, septum midline, mucosa normal, no drainage    or sinus tenderness  Throat:   Lips, mucosa, and tongue normal; teeth and gums normal  Neck:   Supple, symmetrical, trachea midline, no adenopathy;    Thyroid: no enlargement/tenderness/nodules  Back:     Symmetric, no curvature, ROM normal, no CVA tenderness  Lungs:     Clear to auscultation bilaterally, respirations unlabored  Chest Wall:    No tenderness or deformity   Heart:    Regular rate and rhythm, S1 and S2 normal, no murmur, rub   or gallop  Breast Exam:    Deferred to mammo  Abdomen:     Soft, non-tender, bowel sounds active all four quadrants,    no masses, no organomegaly  Genitalia:    Deferred  Rectal:    Extremities:   Extremities normal, atraumatic, no cyanosis or edema  Pulses:   2+ and symmetric all extremities  Skin:   Skin color, texture, turgor normal, no rashes or lesions  Lymph nodes:   Cervical, supraclavicular, and axillary nodes normal  Neurologic:   CNII-XII intact, normal strength, sensation and reflexes    throughout          Assessment & Plan:

## 2023-03-09 NOTE — Patient Instructions (Signed)
Follow up in 1 year or as needed We'll notify you of your lab results and make any changes if needed Keep up the good work on healthy diet and regular exercise- you look great! Call with any questions or concerns Stay Safe! Stay Healthy! Happy Holidays!!! 

## 2023-03-09 NOTE — Assessment & Plan Note (Signed)
Pt's PE WNL.  UTD on pap, mammo, colonoscopy, Tdap.  Repeat DEXA ordered.  Check labs.  Anticipatory guidance provided.

## 2023-03-10 ENCOUNTER — Telehealth: Payer: Self-pay

## 2023-03-10 LAB — HEPATIC FUNCTION PANEL
ALT: 23 U/L (ref 0–35)
AST: 25 U/L (ref 0–37)
Albumin: 4.2 g/dL (ref 3.5–5.2)
Alkaline Phosphatase: 55 U/L (ref 39–117)
Bilirubin, Direct: 0.1 mg/dL (ref 0.0–0.3)
Total Bilirubin: 0.8 mg/dL (ref 0.2–1.2)
Total Protein: 7.3 g/dL (ref 6.0–8.3)

## 2023-03-10 LAB — BASIC METABOLIC PANEL
BUN: 17 mg/dL (ref 6–23)
CO2: 27 meq/L (ref 19–32)
Calcium: 9 mg/dL (ref 8.4–10.5)
Chloride: 103 meq/L (ref 96–112)
Creatinine, Ser: 0.75 mg/dL (ref 0.40–1.20)
GFR: 84.67 mL/min (ref >60.00)
Glucose, Bld: 81 mg/dL (ref 70–99)
Potassium: 4 meq/L (ref 3.5–5.1)
Sodium: 137 meq/L (ref 135–145)

## 2023-03-10 LAB — CBC WITH DIFFERENTIAL/PLATELET
Basophils Absolute: 0.1 10*3/uL (ref 0.0–0.1)
Basophils Relative: 1.3 % (ref 0.0–3.0)
Eosinophils Absolute: 0.1 10*3/uL (ref 0.0–0.7)
Eosinophils Relative: 2.1 % (ref 0.0–5.0)
HCT: 41.3 % (ref 36.0–46.0)
Hemoglobin: 13.7 g/dL (ref 12.0–15.0)
Lymphocytes Relative: 28.5 % (ref 12.0–46.0)
Lymphs Abs: 1.9 10*3/uL (ref 0.7–4.0)
MCHC: 33.2 g/dL (ref 30.0–36.0)
MCV: 96.2 fL (ref 78.0–100.0)
Monocytes Absolute: 0.7 10*3/uL (ref 0.1–1.0)
Monocytes Relative: 10.8 % (ref 3.0–12.0)
Neutro Abs: 3.7 10*3/uL (ref 1.4–7.7)
Neutrophils Relative %: 57.3 % (ref 43.0–77.0)
Platelets: 216 10*3/uL (ref 150.0–400.0)
RBC: 4.3 Mil/uL (ref 3.87–5.11)
RDW: 12.8 % (ref 11.5–15.5)
WBC: 6.5 10*3/uL (ref 4.0–10.5)

## 2023-03-10 LAB — LIPID PANEL
Cholesterol: 168 mg/dL (ref 0–200)
HDL: 56.6 mg/dL (ref >39.00)
LDL Cholesterol: 99 mg/dL (ref 0–99)
NonHDL: 111.67
Total CHOL/HDL Ratio: 3
Triglycerides: 65 mg/dL (ref 0.0–149.0)
VLDL: 13 mg/dL (ref 0.0–40.0)

## 2023-03-10 LAB — TSH: TSH: 1.31 u[IU]/mL (ref 0.35–5.50)

## 2023-03-10 LAB — HEMOGLOBIN A1C: Hgb A1c MFr Bld: 6 % (ref 4.6–6.5)

## 2023-03-10 LAB — VITAMIN D 25 HYDROXY (VIT D DEFICIENCY, FRACTURES): VITD: 94.09 ng/mL (ref 30.00–100.00)

## 2023-03-10 NOTE — Telephone Encounter (Signed)
-----   Message from Neena Rhymes sent at 03/10/2023  2:27 PM EST ----- Labs look great!  No changes at this time

## 2023-03-11 NOTE — Telephone Encounter (Signed)
Pt has reviewed via MyChart

## 2023-04-07 ENCOUNTER — Telehealth: Payer: Self-pay | Admitting: Family Medicine

## 2023-04-07 NOTE — Telephone Encounter (Signed)
 Signed and returned to [place]

## 2023-04-07 NOTE — Telephone Encounter (Signed)
 Type of form received: Bone Density Order  Additional comments:   Received by: [place] - Front Desk  Form should be Faxed/mailed to: (address/ fax #) [phone]  Is patient requesting call for pickup: N/A  Form placed:  Labeled & placed in provider bin  Attach charge sheet.  Provider will determine charge.  Individual made aware of 3-5 business day turn around? N/A

## 2023-04-07 NOTE — Telephone Encounter (Signed)
 Placed in folder at nurse station

## 2023-04-29 LAB — HM DEXA SCAN

## 2023-04-29 LAB — HM MAMMOGRAPHY

## 2023-04-30 ENCOUNTER — Encounter: Payer: Self-pay | Admitting: Family Medicine

## 2023-05-20 ENCOUNTER — Encounter: Payer: Self-pay | Admitting: Family Medicine

## 2024-04-04 ENCOUNTER — Encounter: Payer: Self-pay | Admitting: Family Medicine

## 2024-04-04 DIAGNOSIS — F5102 Adjustment insomnia: Secondary | ICD-10-CM

## 2024-04-04 MED ORDER — ESZOPICLONE 1 MG PO TABS: 1.0000 mg | ORAL_TABLET | Freq: Every evening | ORAL | 0 refills | Status: AC | PRN

## 2024-04-04 NOTE — Telephone Encounter (Signed)
 [facility] VISIT   Patient agreed to [facility] visit and is aware that copayment and coinsurance may apply. Patient was treated using telemedicine according to accepted telemedicine protocols.  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 Please see myChart communication and orders below.   No orders of the defined types were placed in this encounter.  Meds ordered this encounter  Medications   eszopiclone  (LUNESTA ) 1 MG TABS tablet    Sig: Take 1 tablet (1 mg total) by mouth at bedtime as needed for sleep. Take immediately before bedtime    Dispense:  30 tablet    Refill:  0    Youlanda Tomassetti, MD 04/04/2024  A total of 6 minutes were spent by me to personally review the patient-generated inquiry, review patient records and data pertinent to assessment of the patient's problem, develop a management plan including generation of prescriptions and/or orders, and on subsequent communication with the patient through secure the MyChart portal service.   There is no separately reported E/M service related to this service in the past 7 days nor does the patient have an upcoming soonest  available appointment for this issue. This work was completed in less than 7 days.   The patient consented to this service today (see patient agreement prior to ongoing communication). Patient counseled regarding the need for in-person exam for certain conditions and was advised to call the office if any changing or worsening symptoms occur.   The codes to be used for the E/M service are: [x]   99421 for 5-10 minutes of time spent on the inquiry. []   [id number] for 11-20 minutes. []   99423 for 21+ minutes.      [1]  Current Outpatient Medications:    eszopiclone  (LUNESTA ) 1 MG TABS tablet, Take 1 tablet (1 mg total) by mouth at bedtime as needed for sleep. Take immediately before bedtime, Disp: 30 tablet, Rfl: 0   b complex vitamins capsule, Take 1 capsule by mouth daily., Disp: , Rfl:    Cholecalciferol (VITAMIN D3) 5000 UNITS CAPS, Take 2 capsules by mouth daily., Disp: , Rfl:    clonazePAM  (KLONOPIN ) 0.5 MG tablet, Take 0.5 tablets (0.25 mg total) by mouth 2 (two) times daily as needed for anxiety., Disp: 30 tablet, Rfl: 1   cyclobenzaprine  (FLEXERIL ) 10 MG tablet, Take 1 tablet (10 mg total) by mouth 3 (three) times daily as needed for muscle spasms., Disp: 30 tablet, Rfl: 3   eletriptan  (RELPAX ) 40 MG tablet, Take 1 tablet (40 mg total) by mouth once for 1 dose. One tablet by mouth at onset of headache. May repeat in 2 hours if needed, Disp: 10 tablet, Rfl: 6   Levothyroxine  Sodium (SYNTHROID   PO), Take 60 mcg by mouth in the morning and at bedtime., Disp: , Rfl:    magnesium aspartate (MAGINEX) 615 MG tablet, Take 615 mg by mouth at bedtime., Disp: , Rfl:    Menaquinone-7 (VITAMIN K2) 100 MCG CAPS, Take 1 tablet by mouth daily., Disp: , Rfl:    Multiple Vitamin (MULTIVITAMIN) tablet, Take 1 tablet by mouth daily., Disp: , Rfl:    Omega-3 Fatty Acids (OMEGA-3 FISH OIL PO), Take 820 mg by mouth 2 (two) times daily., Disp: , Rfl:    promethazine  (PHENERGAN ) 25 MG tablet, Take 1 tablet (25 mg  total) by mouth every 8 (eight) hours as needed for nausea or vomiting., Disp: 20 tablet, Rfl: 0   promethazine  (PHENERGAN ) 25 MG tablet, Take 1 tablet (25 mg total) by mouth every 6 (six) hours as needed for nausea., Disp: 30 tablet, Rfl: 1   sucralfate  (CARAFATE ) 1 g tablet, Take 1 tablet (1 g total) by mouth in the morning, at noon, and at bedtime., Disp: 90 tablet, Rfl: 1   thyroid  (ARMOUR) 32.5 MG tablet, Take by mouth., Disp: , Rfl:    Turmeric (QC TUMERIC COMPLEX PO), Take by mouth., Disp: , Rfl:    vitamin C (ASCORBIC ACID) 500 MG tablet, Take 1 tablet by mouth daily., Disp: , Rfl:  [2]  Allergies Allergen Reactions   Latex Rash   Penicillins Swelling    Has patient had a PCN reaction causing immediate rash, facial/tongue/throat swelling, SOB or lightheadedness with hypotension: Yes Has patient had a PCN reaction causing severe rash involving mucus membranes or skin necrosis: No Has patient had a PCN reaction that required hospitalization Yes- pcp visit Has patient had a PCN reaction occurring within the last 10 years: no, childhood allergy If all of the above answers are NO, then may proceed with Cephalosporin use.    Zofran [Ondansetron Hcl]     Patient has severe headaches with Zofran.   Dairy Aid [Tilactase]    Gluten Meal

## 2024-04-04 NOTE — Telephone Encounter (Signed)
 Patient is coming in on the 12th but is requesting medication to help with sleep. Patient has much going on and is having trouble sleeping. Please view message below.

## 2024-04-10 ENCOUNTER — Encounter: Payer: Self-pay | Admitting: Family Medicine

## 2024-04-10 ENCOUNTER — Ambulatory Visit (INDEPENDENT_AMBULATORY_CARE_PROVIDER_SITE_OTHER): Payer: BC Managed Care – PPO | Admitting: Family Medicine

## 2024-04-10 VITALS — BP 104/68 | HR 56 | Ht 65.0 in | Wt 144.5 lb

## 2024-04-10 DIAGNOSIS — M8580 Other specified disorders of bone density and structure, unspecified site: Secondary | ICD-10-CM

## 2024-04-10 DIAGNOSIS — Z1159 Encounter for screening for other viral diseases: Secondary | ICD-10-CM | POA: Diagnosis not present

## 2024-04-10 DIAGNOSIS — E038 Other specified hypothyroidism: Secondary | ICD-10-CM

## 2024-04-10 DIAGNOSIS — Z0000 Encounter for general adult medical examination without abnormal findings: Secondary | ICD-10-CM | POA: Diagnosis not present

## 2024-04-10 DIAGNOSIS — Z114 Encounter for screening for human immunodeficiency virus [HIV]: Secondary | ICD-10-CM | POA: Diagnosis not present

## 2024-04-10 LAB — HEPATIC FUNCTION PANEL
ALT: 23 U/L (ref 3–35)
AST: 24 U/L (ref 5–37)
Albumin: 4.5 g/dL (ref 3.5–5.2)
Alkaline Phosphatase: 58 U/L (ref 39–117)
Bilirubin, Direct: 0.1 mg/dL (ref 0.1–0.3)
Total Bilirubin: 0.6 mg/dL (ref 0.2–1.2)
Total Protein: 7.2 g/dL (ref 6.0–8.3)

## 2024-04-10 LAB — CBC WITH DIFFERENTIAL/PLATELET
Basophils Absolute: 0 K/uL (ref 0.0–0.1)
Basophils Relative: 0.9 % (ref 0.0–3.0)
Eosinophils Absolute: 0.2 K/uL (ref 0.0–0.7)
Eosinophils Relative: 4.6 % (ref 0.0–5.0)
HCT: 38.7 % (ref 36.0–46.0)
Hemoglobin: 13.2 g/dL (ref 12.0–15.0)
Lymphocytes Relative: 27.3 % (ref 12.0–46.0)
Lymphs Abs: 1.4 K/uL (ref 0.7–4.0)
MCHC: 34.2 g/dL (ref 30.0–36.0)
MCV: 93.3 fl (ref 78.0–100.0)
Monocytes Absolute: 0.6 K/uL (ref 0.1–1.0)
Monocytes Relative: 12 % (ref 3.0–12.0)
Neutro Abs: 2.8 K/uL (ref 1.4–7.7)
Neutrophils Relative %: 55.2 % (ref 43.0–77.0)
Platelets: 191 K/uL (ref 150.0–400.0)
RBC: 4.15 Mil/uL (ref 3.87–5.11)
RDW: 12.7 % (ref 11.5–15.5)
WBC: 5.1 K/uL (ref 4.0–10.5)

## 2024-04-10 LAB — LIPID PANEL
Cholesterol: 184 mg/dL (ref 28–200)
HDL: 54.5 mg/dL
LDL Cholesterol: 112 mg/dL — ABNORMAL HIGH (ref 10–99)
NonHDL: 129.22
Total CHOL/HDL Ratio: 3
Triglycerides: 84 mg/dL (ref 10.0–149.0)
VLDL: 16.8 mg/dL (ref 0.0–40.0)

## 2024-04-10 LAB — BASIC METABOLIC PANEL WITH GFR
BUN: 18 mg/dL (ref 6–23)
CO2: 28 meq/L (ref 19–32)
Calcium: 9 mg/dL (ref 8.4–10.5)
Chloride: 102 meq/L (ref 96–112)
Creatinine, Ser: 0.76 mg/dL (ref 0.40–1.20)
GFR: 82.7 mL/min
Glucose, Bld: 95 mg/dL (ref 70–99)
Potassium: 4.3 meq/L (ref 3.5–5.1)
Sodium: 136 meq/L (ref 135–145)

## 2024-04-10 LAB — VITAMIN D 25 HYDROXY (VIT D DEFICIENCY, FRACTURES): VITD: 74.23 ng/mL (ref 30.00–100.00)

## 2024-04-10 LAB — TSH: TSH: 1.35 u[IU]/mL (ref 0.35–5.50)

## 2024-04-10 MED ORDER — CYCLOBENZAPRINE HCL 10 MG PO TABS: 10.0000 mg | ORAL_TABLET | Freq: Three times a day (TID) | ORAL | 3 refills | Status: AC | PRN

## 2024-04-10 MED ORDER — PROMETHAZINE HCL 25 MG PO TABS: 25.0000 mg | ORAL_TABLET | Freq: Three times a day (TID) | ORAL | 0 refills | Status: AC | PRN

## 2024-04-10 MED ORDER — CLONAZEPAM 0.5 MG PO TABS: 0.2500 mg | ORAL_TABLET | Freq: Two times a day (BID) | ORAL | 1 refills | Status: DC | PRN

## 2024-04-10 MED ORDER — ELETRIPTAN HYDROBROMIDE 40 MG PO TABS: 40.0000 mg | ORAL_TABLET | Freq: Once | ORAL | 6 refills | Status: AC

## 2024-04-10 NOTE — Patient Instructions (Signed)
 Follow up in 1 year or as needed We'll notify you of your lab results and make any changes if needed Keep up the good work!  You look great! Call with any questions or concerns Stay Safe!  Stay Healthy! Hang in there!  You have my sympathy!!

## 2024-04-10 NOTE — Assessment & Plan Note (Signed)
 Pt's PE WNL.  UTD on pap, mammo, colonoscopy, Tdap. Declines PNA, flu, shingles.  Check labs.  Anticipatory guidance provided.

## 2024-04-10 NOTE — Progress Notes (Signed)
" ° °  Subjective:    Patient ID: Laura Brock, female    DOB: 1960-12-26, 64 y.o.   MRN: [id number]  HPI CPE- UTD on mammo, DEXA, pap, colonoscopy, Tdap. Wants to hold off on PNA vaccie  Health Maintenance  Topic Date Due   HIV Screening  Never done   Hepatitis C Screening  Never done   Pneumococcal Vaccine: 50+ Years (1 of 1 - PCV) Never done   Zoster Vaccines- Shingrix (1 of 2) Never done   Influenza Vaccine  Never done   COVID-19 Vaccine (1 - 2025-26 season) Never done   Mammogram  04/28/2024   Bone Density Scan  04/28/2025   Cervical Cancer Screening (HPV/Pap Cotest)  05/27/2025   DTaP/Tdap/Td (2 - Td or Tdap) 01/06/2026   Colonoscopy  02/01/2031   Hepatitis B Vaccines 19-59 Average Risk  Aged Out   HPV VACCINES  Aged Out   Meningococcal B Vaccine  Aged Out    Patient Care Team    Relationship Specialty Notifications Start End  Mahlon Comer BRAVO, MD PCP - General Family Medicine  07/04/14   Artis Norris, MD Referring Physician Internal Medicine  07/03/14   Elson LELON Beath, DC Consulting Physician Chiropractic Medicine  07/04/14   Cheryn Nickels, MD Referring Physician Allergy and Immunology  07/04/14   Marvis Ronal BIRCH., MD Referring Physician Gastroenterology  07/04/14   Mat Browning, MD Consulting Physician Obstetrics and Gynecology  01/03/15       Review of Systems Patient reports no vision/ hearing changes, adenopathy,fever, weight change,  persistant/recurrent hoarseness , swallowing issues, chest pain, palpitations, edema, persistant/recurrent cough, hemoptysis, dyspnea (rest/exertional/paroxysmal nocturnal), gastrointestinal bleeding (melena, rectal bleeding), abdominal pain, significant heartburn, bowel changes, GU symptoms (dysuria, hematuria, incontinence), Gyn symptoms (abnormal  bleeding, pain),  syncope, focal weakness, memory loss, numbness & tingling, skin/hair/nail changes, abnormal bruising or bleeding, anxiety, or depression.     Objective:   Physical  Exam General Appearance:    Alert, cooperative, no distress, appears stated age  Head:    Normocephalic, without obvious abnormality, atraumatic  Eyes:    PERRL, conjunctiva/corneas clear, EOM's intact both eyes  Ears:    Normal TM's and external ear canals, both ears  Nose:   Nares normal, septum midline, mucosa normal, no drainage    or sinus tenderness  Throat:   Lips, mucosa, and tongue normal; teeth and gums normal  Neck:   Supple, symmetrical, trachea midline, no adenopathy;    Thyroid : no enlargement/tenderness/nodules  Back:     Symmetric, no curvature, ROM normal, no CVA tenderness  Lungs:     Clear to auscultation bilaterally, respirations unlabored  Chest Wall:    No tenderness or deformity   Heart:    Regular rate and rhythm, S1 and S2 normal, no murmur, rub   or gallop  Breast Exam:    Deferred to GYN  Abdomen:     Soft, non-tender, bowel sounds active all four quadrants,    no masses, no organomegaly  Genitalia:    Deferred to GYN  Rectal:    Extremities:   Extremities normal, atraumatic, no cyanosis or edema  Pulses:   2+ and symmetric all extremities  Skin:   Skin color, texture, turgor normal, no rashes or lesions  Lymph nodes:   Cervical, supraclavicular, and axillary nodes normal  Neurologic:   CNII-XII intact, normal strength, sensation and reflexes    throughout          Assessment & Plan:    "

## 2024-04-11 ENCOUNTER — Ambulatory Visit: Payer: Self-pay | Admitting: Family Medicine

## 2024-04-11 LAB — HIV ANTIBODY (ROUTINE TESTING W REFLEX)
HIV 1&2 Ab, 4th Generation: NONREACTIVE
HIV FINAL INTERPRETATION: NEGATIVE

## 2024-04-11 LAB — HEPATITIS C ANTIBODY: Hepatitis C Ab: NONREACTIVE

## 2024-04-11 NOTE — Progress Notes (Signed)
 Lab results have been discussed.   Verbalized understanding? Yes  Are there any questions? No

## 2024-04-17 ENCOUNTER — Encounter: Payer: Self-pay | Admitting: Family Medicine

## 2024-04-18 MED ORDER — CLONAZEPAM 0.5 MG PO TABS: 0.2500 mg | ORAL_TABLET | Freq: Two times a day (BID) | ORAL | 1 refills | Status: AC | PRN

## 2024-04-18 NOTE — Telephone Encounter (Signed)
 Pended medication, patient needs [employer] manufacturer

## 2025-04-11 ENCOUNTER — Encounter: Payer: Self-pay | Admitting: Family Medicine
